# Patient Record
Sex: Female | Born: 1960 | Race: White | Hispanic: No | Marital: Married | Smoking: Current some day smoker
Health system: Southern US, Community
[De-identification: ages and names within clinical notes are randomized; demographics above are authoritative.]

## PROBLEM LIST (undated history)

## (undated) DIAGNOSIS — D6861 Antiphospholipid syndrome: Secondary | ICD-10-CM

## (undated) DIAGNOSIS — Q211 Atrial septal defect: Secondary | ICD-10-CM

## (undated) DIAGNOSIS — N289 Disorder of kidney and ureter, unspecified: Secondary | ICD-10-CM

## (undated) DIAGNOSIS — F99 Mental disorder, not otherwise specified: Secondary | ICD-10-CM

## (undated) DIAGNOSIS — Z9889 Other specified postprocedural states: Secondary | ICD-10-CM

## (undated) DIAGNOSIS — E611 Iron deficiency: Secondary | ICD-10-CM

## (undated) DIAGNOSIS — G2581 Restless legs syndrome: Secondary | ICD-10-CM

## (undated) DIAGNOSIS — M25569 Pain in unspecified knee: Secondary | ICD-10-CM

## (undated) DIAGNOSIS — S2239XA Fracture of one rib, unspecified side, initial encounter for closed fracture: Secondary | ICD-10-CM

## (undated) DIAGNOSIS — Q2111 Secundum atrial septal defect: Secondary | ICD-10-CM

## (undated) DIAGNOSIS — E876 Hypokalemia: Secondary | ICD-10-CM

## (undated) DIAGNOSIS — D509 Iron deficiency anemia, unspecified: Secondary | ICD-10-CM

## (undated) DIAGNOSIS — R569 Unspecified convulsions: Secondary | ICD-10-CM

## (undated) DIAGNOSIS — D6859 Other primary thrombophilia: Secondary | ICD-10-CM

## (undated) DIAGNOSIS — E559 Vitamin D deficiency, unspecified: Secondary | ICD-10-CM

## (undated) DIAGNOSIS — R319 Hematuria, unspecified: Principal | ICD-10-CM

## (undated) DIAGNOSIS — M5020 Other cervical disc displacement, unspecified cervical region: Secondary | ICD-10-CM

## (undated) DIAGNOSIS — M419 Scoliosis, unspecified: Secondary | ICD-10-CM

## (undated) DIAGNOSIS — R112 Nausea with vomiting, unspecified: Secondary | ICD-10-CM

## (undated) DIAGNOSIS — M810 Age-related osteoporosis without current pathological fracture: Secondary | ICD-10-CM

## (undated) DIAGNOSIS — T1490XA Injury, unspecified, initial encounter: Secondary | ICD-10-CM

## (undated) DIAGNOSIS — R7989 Other specified abnormal findings of blood chemistry: Secondary | ICD-10-CM

## (undated) HISTORY — DX: Hypokalemia: E87.6

## (undated) HISTORY — DX: Secundum atrial septal defect: Q21.11

## (undated) HISTORY — PX: PATELLA FRACTURE SURGERY: SHX735

## (undated) HISTORY — DX: Other specified abnormal findings of blood chemistry: R79.89

## (undated) HISTORY — DX: Pain in unspecified knee: M25.569

## (undated) HISTORY — PX: ABDOMINAL HYSTERECTOMY: SHX81

## (undated) HISTORY — DX: Iron deficiency: E61.1

## (undated) HISTORY — DX: Mental disorder, not otherwise specified: F99

## (undated) HISTORY — DX: Atrial septal defect: Q21.1

## (undated) HISTORY — DX: Other cervical disc displacement, unspecified cervical region: M50.20

## (undated) HISTORY — DX: Fracture of one rib, unspecified side, initial encounter for closed fracture: S22.39XA

## (undated) HISTORY — DX: Scoliosis, unspecified: M41.9

## (undated) HISTORY — PX: MULTIPLE TOOTH EXTRACTIONS: SHX2053

## (undated) HISTORY — DX: Vitamin D deficiency, unspecified: E55.9

## (undated) HISTORY — DX: Iron deficiency anemia, unspecified: D50.9

## (undated) HISTORY — DX: Age-related osteoporosis without current pathological fracture: M81.0

## (undated) HISTORY — DX: Restless legs syndrome: G25.81

## (undated) HISTORY — DX: Unspecified convulsions: R56.9

## (undated) HISTORY — DX: Other primary thrombophilia: D68.59

## (undated) HISTORY — DX: Antiphospholipid syndrome: D68.61

## (undated) HISTORY — DX: Injury, unspecified, initial encounter: T14.90XA

## (undated) HISTORY — PX: WRIST FRACTURE SURGERY: SHX121

## (undated) HISTORY — DX: Hematuria, unspecified: R31.9

---

## 1999-12-15 DIAGNOSIS — T1490XA Injury, unspecified, initial encounter: Secondary | ICD-10-CM

## 1999-12-15 HISTORY — DX: Injury, unspecified, initial encounter: T14.90XA

## 2001-09-23 ENCOUNTER — Other Ambulatory Visit: Admission: RE | Admit: 2001-09-23 | Discharge: 2001-09-23 | Payer: Self-pay | Admitting: Obstetrics and Gynecology

## 2003-01-23 ENCOUNTER — Ambulatory Visit (HOSPITAL_COMMUNITY): Admission: RE | Admit: 2003-01-23 | Discharge: 2003-01-23 | Payer: Self-pay | Admitting: Obstetrics and Gynecology

## 2003-01-23 ENCOUNTER — Encounter: Payer: Self-pay | Admitting: Obstetrics and Gynecology

## 2004-01-30 ENCOUNTER — Ambulatory Visit (HOSPITAL_BASED_OUTPATIENT_CLINIC_OR_DEPARTMENT_OTHER): Admission: RE | Admit: 2004-01-30 | Discharge: 2004-01-30 | Payer: Self-pay | Admitting: *Deleted

## 2004-01-30 ENCOUNTER — Ambulatory Visit (HOSPITAL_COMMUNITY): Admission: RE | Admit: 2004-01-30 | Discharge: 2004-01-30 | Payer: Self-pay | Admitting: *Deleted

## 2005-11-23 ENCOUNTER — Ambulatory Visit (HOSPITAL_COMMUNITY): Admission: RE | Admit: 2005-11-23 | Discharge: 2005-11-23 | Payer: Self-pay | Admitting: Obstetrics and Gynecology

## 2006-04-14 ENCOUNTER — Ambulatory Visit (HOSPITAL_COMMUNITY): Admission: RE | Admit: 2006-04-14 | Discharge: 2006-04-14 | Payer: Self-pay | Admitting: Pediatrics

## 2006-04-20 ENCOUNTER — Ambulatory Visit (HOSPITAL_COMMUNITY): Admission: RE | Admit: 2006-04-20 | Discharge: 2006-04-20 | Payer: Self-pay | Admitting: Internal Medicine

## 2006-04-29 ENCOUNTER — Ambulatory Visit (HOSPITAL_COMMUNITY): Admission: RE | Admit: 2006-04-29 | Discharge: 2006-04-29 | Payer: Self-pay | Admitting: Neurology

## 2006-05-11 ENCOUNTER — Ambulatory Visit (HOSPITAL_COMMUNITY): Admission: RE | Admit: 2006-05-11 | Discharge: 2006-05-11 | Payer: Self-pay | Admitting: Neurology

## 2006-07-02 ENCOUNTER — Ambulatory Visit: Payer: Self-pay

## 2006-07-20 ENCOUNTER — Ambulatory Visit (HOSPITAL_COMMUNITY): Admission: RE | Admit: 2006-07-20 | Discharge: 2006-07-20 | Payer: Self-pay | Admitting: Neurology

## 2006-08-30 ENCOUNTER — Ambulatory Visit: Payer: Self-pay | Admitting: Oncology

## 2006-08-30 LAB — MORPHOLOGY: PLT EST: ADEQUATE

## 2006-08-30 LAB — CBC WITH DIFFERENTIAL/PLATELET
BASO%: 1.4 % (ref 0.0–2.0)
Basophils Absolute: 0.1 10*3/uL (ref 0.0–0.1)
Eosinophils Absolute: 0.2 10*3/uL (ref 0.0–0.5)
HCT: 30.8 % — ABNORMAL LOW (ref 34.8–46.6)
HGB: 10.2 g/dL — ABNORMAL LOW (ref 11.6–15.9)
LYMPH%: 23.2 % (ref 14.0–48.0)
MCV: 80.4 fL — ABNORMAL LOW (ref 81.0–101.0)
MONO%: 9.5 % (ref 0.0–13.0)
NEUT%: 63 % (ref 39.6–76.8)
WBC: 5.4 10*3/uL (ref 3.9–10.0)
lymph#: 1.2 10*3/uL (ref 0.9–3.3)

## 2006-08-30 LAB — PROTIME-INR: INR: 1.1 — ABNORMAL LOW (ref 2.00–3.50)

## 2006-08-30 LAB — SEDIMENTATION RATE: Sed Rate: 30 mm/hr — ABNORMAL HIGH (ref 0–22)

## 2006-09-02 LAB — HYPERCOAGULABLE PANEL, COMPREHENSIVE
Anticardiolipin IgM: <7 [MPL'U] (ref <10)
DRVVT 1:1 Mix: 40.3 secs (ref 31.9–44.2)
DRVVT: 62 secs — ABNORMAL HIGH (ref 31.9–44.2)
Homocysteine: 6.1 umol/L (ref 4.0–15.4)
Protein C Activity: 117 % (ref 91–147)
Protein S Activity: 78 % — ABNORMAL LOW (ref 81–180)
Protein S Ag, Total: 104 % (ref 63–126)

## 2006-09-02 LAB — COMPREHENSIVE METABOLIC PANEL
ALT: 15 U/L (ref 0–40)
CO2: 23 mEq/L (ref 19–32)
Chloride: 104 mEq/L (ref 96–112)
Glucose, Bld: 84 mg/dL (ref 70–99)
Sodium: 134 mEq/L — ABNORMAL LOW (ref 135–145)
Total Bilirubin: 0.7 mg/dL (ref 0.3–1.2)
Total Protein: 6.9 g/dL (ref 6.0–8.3)

## 2006-09-02 LAB — APTT: aPTT: 53 seconds — ABNORMAL HIGH (ref 24–37)

## 2006-09-02 LAB — LACTATE DEHYDROGENASE: LDH: 229 U/L (ref 94–250)

## 2006-09-21 ENCOUNTER — Ambulatory Visit (HOSPITAL_COMMUNITY): Admission: RE | Admit: 2006-09-21 | Discharge: 2006-09-21 | Payer: Self-pay | Admitting: Obstetrics & Gynecology

## 2006-11-10 ENCOUNTER — Ambulatory Visit: Payer: Self-pay | Admitting: Oncology

## 2006-12-28 ENCOUNTER — Ambulatory Visit: Payer: Self-pay | Admitting: Oncology

## 2007-01-10 ENCOUNTER — Ambulatory Visit (HOSPITAL_COMMUNITY): Admission: RE | Admit: 2007-01-10 | Discharge: 2007-01-10 | Payer: Self-pay | Admitting: Obstetrics and Gynecology

## 2007-02-14 ENCOUNTER — Ambulatory Visit: Payer: Self-pay | Admitting: Oncology

## 2007-02-23 LAB — CBC WITH DIFFERENTIAL/PLATELET
BASO%: 2.1 % — ABNORMAL HIGH (ref 0.0–2.0)
EOS%: 3.4 % (ref 0.0–7.0)
Eosinophils Absolute: 0.2 10*3/uL (ref 0.0–0.5)
LYMPH%: 26.4 % (ref 14.0–48.0)
MCV: 80 fL — ABNORMAL LOW (ref 81.0–101.0)
MONO%: 10.5 % (ref 0.0–13.0)
NEUT#: 3.7 10*3/uL (ref 1.5–6.5)
NEUT%: 57.6 % (ref 39.6–76.8)
Platelets: 296 10*3/uL (ref 145–400)
RBC: 4.16 10*6/uL (ref 3.70–5.32)
RDW: 13.7 % (ref 11.3–14.5)

## 2007-02-23 LAB — PROTIME-INR
INR: 2.7 (ref 2.00–3.50)
Protime: 32.4 Seconds — ABNORMAL HIGH (ref 10.6–13.4)

## 2007-03-02 LAB — PROTIME-INR

## 2007-03-02 LAB — PROTHROMBIN TIME: INR: 3.6 — ABNORMAL HIGH (ref 0.0–1.5)

## 2007-03-11 LAB — PROTIME-INR: Protime: 42 Seconds — ABNORMAL HIGH (ref 10.6–13.4)

## 2007-03-23 LAB — PROTIME-INR: INR: 1.7 — ABNORMAL LOW (ref 2.00–3.50)

## 2007-04-04 ENCOUNTER — Ambulatory Visit: Payer: Self-pay | Admitting: Oncology

## 2007-06-24 ENCOUNTER — Ambulatory Visit: Payer: Self-pay | Admitting: Oncology

## 2007-12-01 ENCOUNTER — Ambulatory Visit: Payer: Self-pay | Admitting: Oncology

## 2007-12-05 LAB — CBC WITH DIFFERENTIAL/PLATELET
HGB: 11.6 g/dL (ref 11.6–15.9)
LYMPH%: 27.1 % (ref 14.0–48.0)
MCH: 26 pg (ref 26.0–34.0)
MCHC: 31.6 g/dL — ABNORMAL LOW (ref 32.0–36.0)
MONO#: 0.8 10*3/uL (ref 0.1–0.9)
NEUT#: 3.9 10*3/uL (ref 1.5–6.5)
NEUT%: 57.9 % (ref 39.6–76.8)
Platelets: 294 10*3/uL (ref 145–400)
WBC: 6.8 10*3/uL (ref 3.9–10.0)
lymph#: 1.8 10*3/uL (ref 0.9–3.3)

## 2007-12-05 LAB — PROTIME-INR: INR: 2.3 (ref 2.00–3.50)

## 2007-12-28 ENCOUNTER — Other Ambulatory Visit: Admission: RE | Admit: 2007-12-28 | Discharge: 2007-12-28 | Payer: Self-pay | Admitting: Obstetrics and Gynecology

## 2008-03-16 ENCOUNTER — Encounter (HOSPITAL_COMMUNITY): Admission: RE | Admit: 2008-03-16 | Discharge: 2008-04-15 | Payer: Self-pay | Admitting: Oncology

## 2008-03-16 ENCOUNTER — Ambulatory Visit (HOSPITAL_COMMUNITY): Payer: Self-pay | Admitting: Oncology

## 2008-03-26 ENCOUNTER — Inpatient Hospital Stay (HOSPITAL_COMMUNITY): Admission: RE | Admit: 2008-03-26 | Discharge: 2008-03-29 | Payer: Self-pay | Admitting: Obstetrics and Gynecology

## 2008-03-26 ENCOUNTER — Encounter: Payer: Self-pay | Admitting: Obstetrics and Gynecology

## 2008-03-27 ENCOUNTER — Ambulatory Visit: Payer: Self-pay | Admitting: Oncology

## 2008-04-16 ENCOUNTER — Encounter (HOSPITAL_COMMUNITY): Admission: RE | Admit: 2008-04-16 | Discharge: 2008-05-16 | Payer: Self-pay | Admitting: Oncology

## 2008-05-14 ENCOUNTER — Ambulatory Visit (HOSPITAL_COMMUNITY): Payer: Self-pay | Admitting: Oncology

## 2008-05-23 ENCOUNTER — Encounter (HOSPITAL_COMMUNITY): Admission: RE | Admit: 2008-05-23 | Discharge: 2008-06-22 | Payer: Self-pay | Admitting: Oncology

## 2008-05-31 ENCOUNTER — Ambulatory Visit: Payer: Self-pay | Admitting: Oncology

## 2008-06-29 ENCOUNTER — Encounter (HOSPITAL_COMMUNITY): Admission: RE | Admit: 2008-06-29 | Discharge: 2008-07-29 | Payer: Self-pay | Admitting: Oncology

## 2008-06-29 ENCOUNTER — Ambulatory Visit (HOSPITAL_COMMUNITY): Payer: Self-pay | Admitting: Oncology

## 2008-07-04 ENCOUNTER — Ambulatory Visit: Payer: Self-pay | Admitting: Oncology

## 2008-08-15 ENCOUNTER — Ambulatory Visit (HOSPITAL_COMMUNITY): Payer: Self-pay | Admitting: Oncology

## 2008-08-15 ENCOUNTER — Encounter (HOSPITAL_COMMUNITY): Admission: RE | Admit: 2008-08-15 | Discharge: 2008-09-14 | Payer: Self-pay | Admitting: Oncology

## 2008-09-25 ENCOUNTER — Encounter (HOSPITAL_COMMUNITY): Admission: RE | Admit: 2008-09-25 | Discharge: 2008-10-25 | Payer: Self-pay | Admitting: Oncology

## 2008-10-03 ENCOUNTER — Ambulatory Visit (HOSPITAL_COMMUNITY): Payer: Self-pay | Admitting: Oncology

## 2008-10-31 ENCOUNTER — Encounter (HOSPITAL_COMMUNITY): Admission: RE | Admit: 2008-10-31 | Discharge: 2008-11-30 | Payer: Self-pay | Admitting: Oncology

## 2008-11-29 ENCOUNTER — Ambulatory Visit (HOSPITAL_COMMUNITY): Payer: Self-pay | Admitting: Oncology

## 2010-03-10 ENCOUNTER — Ambulatory Visit (HOSPITAL_COMMUNITY): Payer: Self-pay | Admitting: Oncology

## 2010-03-10 ENCOUNTER — Encounter (HOSPITAL_COMMUNITY): Admission: RE | Admit: 2010-03-10 | Discharge: 2010-04-09 | Payer: Self-pay | Admitting: Oncology

## 2010-07-11 ENCOUNTER — Ambulatory Visit (HOSPITAL_COMMUNITY): Admission: RE | Admit: 2010-07-11 | Discharge: 2010-07-11 | Payer: Self-pay | Admitting: Obstetrics & Gynecology

## 2011-01-04 ENCOUNTER — Encounter: Payer: Self-pay | Admitting: Obstetrics and Gynecology

## 2011-01-04 ENCOUNTER — Encounter: Payer: Self-pay | Admitting: Pulmonary Disease

## 2011-03-06 ENCOUNTER — Encounter (HOSPITAL_COMMUNITY): Payer: BC Managed Care – PPO | Attending: Oncology

## 2011-03-06 ENCOUNTER — Other Ambulatory Visit (HOSPITAL_COMMUNITY): Payer: Self-pay

## 2011-03-06 DIAGNOSIS — Z79899 Other long term (current) drug therapy: Secondary | ICD-10-CM | POA: Insufficient documentation

## 2011-03-06 DIAGNOSIS — Z7901 Long term (current) use of anticoagulants: Secondary | ICD-10-CM | POA: Insufficient documentation

## 2011-03-06 DIAGNOSIS — D509 Iron deficiency anemia, unspecified: Secondary | ICD-10-CM | POA: Insufficient documentation

## 2011-03-06 DIAGNOSIS — Z86718 Personal history of other venous thrombosis and embolism: Secondary | ICD-10-CM | POA: Insufficient documentation

## 2011-03-06 DIAGNOSIS — G2581 Restless legs syndrome: Secondary | ICD-10-CM | POA: Insufficient documentation

## 2011-03-06 DIAGNOSIS — D6859 Other primary thrombophilia: Secondary | ICD-10-CM | POA: Insufficient documentation

## 2011-03-08 LAB — PROTIME-INR: INR: 1.29 (ref 0.00–1.49)

## 2011-03-08 LAB — CBC: MCHC: 34 g/dL (ref 30.0–36.0)

## 2011-03-09 ENCOUNTER — Encounter (HOSPITAL_COMMUNITY): Payer: BC Managed Care – PPO

## 2011-03-09 ENCOUNTER — Ambulatory Visit (HOSPITAL_COMMUNITY): Payer: BC Managed Care – PPO | Admitting: Oncology

## 2011-03-09 ENCOUNTER — Other Ambulatory Visit (HOSPITAL_COMMUNITY): Payer: BC Managed Care – PPO

## 2011-03-09 DIAGNOSIS — D6859 Other primary thrombophilia: Secondary | ICD-10-CM

## 2011-04-28 NOTE — Discharge Summary (Signed)
Samantha Fields, Samantha Fields                 ACCOUNT NO.:  [id number]   MEDICAL RECORD NO.:  [id number]          PATIENT TYPE:  INP   LOCATION:  A302                          FACILITY:  [facility]   PHYSICIAN:  Tilda Burrow, M.D. DATE OF BIRTH:  Apr 20, 1961   DATE OF ADMISSION:  03/26/2008  DATE OF DISCHARGE:  04/16/2009LH                               DISCHARGE SUMMARY   ADMITTING DIAGNOSES:  1. Uterine fibroids 14 weeks size.  2. History of lupus antibodies.  3. 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  4. Patent foramen ovale, stable.  5. History of thrombotic cerebrovascular accident event in 2007.   DISCHARGE DIAGNOSES:  1. Uterine fibroids 14 weeks size, removed.  2. History of lupus antibodies.  3. 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  4. Patent foramen ovale, stable.  5. History of thrombotic cerebrovascular accident event in 2007.   PROCEDURES:  Total abdominal hysterectomy.   DISCHARGE MEDICATIONS:  1. Lamictal 100 mg p.o. daily.  2. Hydrochlorothiazide 25 mg p.o. daily.  3. Iron 325 mg p.o. twice daily.  4. Celexa 20 mg p.o. daily.  5. Resume aspirin 325 mg p.o. daily.  6. Lovenox 100 mg subcu twice daily as per Dr. Mariel Sleet, for 1 week.  7. Coumadin 4 mg and 3 mg alternating daily doses.   HOSPITAL SUMMARY:  This is a 50 year old premenopausal female admitted  for abdominal hysterectomy due to symptomatic uterine fibroids and  anemia associated with heavy menses.  History is well documented in the  HPI and notable for the clotting disorders, which has resulted in 4  anticoagulation after 2007.  A minor CVA event from which she had full  recovery.   HOSPITAL COURSE:  The patient was admitted and underwent abdominal  hysterectomy through a Pelosi incision with wide excision of the old  scar on March 26, 2008.  EBL was approximately 100 mL.  Jackson-Pratt  drain was placed in subcu sites.   Postoperatively, the patient remained  stable.  The uterus and fibroids weighed 885 g.  Cervix was benign.  The  patient never had fevers.  She had admitting hemoglobin of 9.4,  hematocrit 29.0.  Postoperative hemoglobin dropped to 8.3, hematocrit  25.3.  Tolerated well by the patient.  She said she did never require  transfusions.  She will simply be followed up through the office. She  had return of bowel function with flatus and resumption of bowel  activity in standard fashion.  We will be seeing back on April 02, 2008,  for staple removal and drain removal and April 03, 2008, for PT and PTT  levels per Dr. Mariel Sleet.      Tilda Burrow, M.D.  Electronically Signed     JVF/MEDQ  D:  03/29/2008  T:  03/29/2008  Job:  [id number]   cc:   Genene Churn. Cyndie Chime, M.D.  Fax: [phone]   Ladona Horns. Mariel Sleet, MD  Fax: [phone]

## 2011-04-28 NOTE — Op Note (Signed)
NAMECLARISE, CHACKO                 ACCOUNT NO.:  [id number]   MEDICAL RECORD NO.:  [id number]          PATIENT TYPE:  INP   LOCATION:  A302                          FACILITY:  [facility]   PHYSICIAN:  Tilda Burrow, M.D. DATE OF BIRTH:  [birth year]   DATE OF PROCEDURE:  03/26/2008  DATE OF DISCHARGE:                               OPERATIVE REPORT   PREOPERATIVE DIAGNOSES:  1. Uterine fibroids 14 weeks.  2. History of lupus antibodies.  3. Prior history of multiple episodes of postprocedure deep vein      thrombosis in lower extremities.  4. History of the patent foramen ovale.  5. History of thrombotic cerebrovascular event 2007.   POSTOPERATIVE DIAGNOSES:  1. Uterine fibroids 14 weeks.  2. History of lupus antibodies.  3. Prior history of multiple episodes postprocedure deep vein      thrombosis in lower extremities.  4. History of the patent foramen ovale.  5. History of thrombotic cerebrovascular event 2007.   PROCEDURE:  Total abdominal hysterectomy.   SURGEON:  Tilda Burrow, M.D.   ASSISTANT:  _____none_____   ANESTHESIA:  General.   COMPLICATIONS:  None.   FINDINGS:  Large 10-cm fibroid enlargement of the uterine fundus.  Huge  singleton fibroid was located just behind the insertion of the left tube  and left round ligament and distorting the left fundal portion of the  uterus primarily.   DETAILS OF PROCEDURE:  The patient was taken to the operating room,  prepped and draped for lower abdominal surgery.  The prior C-section  incision was repeated and extended widely for a total distance of  approximately 35 cm, with removal of a 8 cm wide ellipse of skin and  fatty tissue.  This was taken down through the superficial skin and  fatty tissue with a relatively thick layer of fatty tissue left on the  fascia.  Midline incision was selected for entry of the abdominal cavity  making the, (Pelosi incision).  Peritoneal cavity was easily entered  without any adhesions  encountered and the incision extended cephalad and  caudad sufficiently to visualize the pelvis adequately for surgery.  Large fibroid uterus was quite mobile and inspection of the uterus at  the fibroid was in the left cornual area intramural and by rotating the  uterus inside the abdominal cavity.  We were able to identify the round  ligaments on each side.  Round ligaments were clamped, cut and suture  ligated and taken down.  The uterine vessels on the left side and the  utero-ovarian ligament was somewhat splayed, but could be carefully  taken down and small bites clamping, cutting, and suture ligating with  Kelly clamp placed on the uterine side for back bleeding in the pedicles  ligated with zero chromic and made quite hemostatic.  Horizontal  mattress sutures were used to close to ligate the pedicles on the  uterine body.  Hemostasis was quite good.  The bladder flap was  developed adequately anteriorly.  Once the uterine vessels could be  clamped, cut, and suture ligated on either side using 2 curved  Heaney  clamps double ligature and then a Kelly clamp for back bleeding, the  rest of the procedure was straightforward.  Straight Heaney clamps were  used on the upper aspects of the cardinal ligaments, clamping, cutting  and suture ligating.  The fundus of the uterus was then amputated off of  the small elongated lower uterine segment and visualization dramatically  improved.  Lahey thyroid tenaculum was then positioned on the stop of  the lower uterine segment and we marched down each side of the cervix  clamping, cutting, and suture ligating in standard fashion using  straight Heaney clamps, knife dissection, and 0 chromic suture ligature.  Upon reaching the level of the cervix, a stab incision was made in the  anterior cervical vaginal fornix, Kocher clamp used to grasp the cuff  and the remainder of the cuff amputated off, using Mayo scissors.  The  cuff was then closed with  Aldridge stitch placed at each lateral angle  for reestablishing pelvic support and the cuff reduced in a lateral  diameter by placing a figure-of-eight suture and anteriorly at 12  o'clock and posteriorly at 6 o'clock to produce the transverse diameter  of the vaginal apex.  The remainder of the cuff was closed with  interrupted 0 chromic interrupted sutures across the length of the  vaginal cuff.  Hemostasis was excellent.  Pelvis was irrigated,  inspected and pedicles inspected and hemostasis considered excellent.  Bladder flap was loosely re-approximated over the cuff in the midline.  Laparotomy equipment was removed.  Laparotomy tapes removed, sponge  counts were correct.  Anterior peritoneum was closed with running 2-0  chromic.  The fascia was then closed with 2 segments of continuous  running 0-Prolene 1 segment back and upper half incision on the lower  half.  The subcu fatty tissues were irrigated, inspected, confirmed as  hemostatic.  A flat JP drain was placed in the open space and then the  superficial fatty tissues pulled over with a series of interrupted 2-0  plain sutures and then horizontal subcuticular 2-0 plain sutures  followed by staple closure of the skin.  JP drain was allowed to exit  through the left into the incision was sutured in place.  EBLs of the  total procedure was approximately 100 mL.  The patient tolerated the  procedure excellently with good hemostasis throughout and went to  recovery room in stable condition.   ADDENDUM:  The patient will resume Lovenox prophylaxis 24 hours after  surgery and then be converted back to Coumadin.      Tilda Burrow, M.D.  Electronically Signed     JVF/MEDQ  D:  03/28/2008  T:  03/29/2008  Job:  [id number]
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
## 2011-05-01 NOTE — Procedures (Signed)
EEG NUMBER:  [id number]   CLINICAL HISTORY:  A 50 year old female with an episode of unresponsiveness.  She was unable to be awakened.  EMS was called.  She was confused and to  unable recognize others.   PROCEDURE:  The tracing was carried out on a 32-channel digital Cadwell  recorder reformatted into 16-channel montages with one devoted to EKG.  The  patient was awake during the recording.  The [employer] 10/20 system lead  placement used.   DESCRIPTION OF FINDINGS:  Dominant frequency is an 11 Hz 15-35 microvolt  activity that is well-regulated and attenuates partially with eye opening.   Background activity shows a mixture of frontally predominant beta range  activity.   Photic stimulation induced a sustained driving response between 7 and 17 Hz.  Hyperventilation caused little change.  EKG showed a regular sinus rhythm  with ventricular response at 66 beats per minute.   IMPRESSION:  Normal waking record.      Deanna Artis. Sharene Skeans, M.D.  Electronically Signed     QIO:NGEX  D:  04/20/2006 21:06:00  T:  04/21/2006 20:31:16  Job #:  [id number]   cc:   Leane Platt

## 2011-06-01 ENCOUNTER — Other Ambulatory Visit (HOSPITAL_COMMUNITY): Payer: BC Managed Care – PPO

## 2011-07-13 ENCOUNTER — Telehealth (HOSPITAL_COMMUNITY): Payer: Self-pay | Admitting: *Deleted

## 2011-07-13 NOTE — Telephone Encounter (Signed)
Received call back from pt. States that she has been compliant with monthly pt/inr checks at [facility] labs. Informed her per Dr.Neijstroms note regarding compliance with labs and whether or not she would like a 2nd opinion at [facility] with a coagulation specialist to see if they would d/c Coumadin - it might help. Pt insistent that she has been doing monthly lab checks. Informed her we do not have any results from April or May. She said she will follow up with that because she is sure she had them done. Pt became tearful and said that she will find another doctor to manage her Coumadin. Pt asked that we copy her records and she will come by office this week to pick them up.

## 2011-07-13 NOTE — Telephone Encounter (Signed)
Left messages on both home and cell numbers. Informed pt we needed to speak with her about renewing lab orders through [facility] for monthly pt/inr.

## 2011-07-30 ENCOUNTER — Other Ambulatory Visit: Payer: Self-pay | Admitting: Obstetrics and Gynecology

## 2011-07-30 DIAGNOSIS — Z139 Encounter for screening, unspecified: Secondary | ICD-10-CM

## 2011-08-03 ENCOUNTER — Ambulatory Visit (HOSPITAL_COMMUNITY): Payer: BC Managed Care – PPO | Admitting: Oncology

## 2011-08-14 ENCOUNTER — Encounter (HOSPITAL_COMMUNITY): Payer: BC Managed Care – PPO | Attending: Oncology | Admitting: Oncology

## 2011-08-14 ENCOUNTER — Encounter (HOSPITAL_COMMUNITY): Payer: Self-pay | Admitting: Oncology

## 2011-08-14 ENCOUNTER — Telehealth (HOSPITAL_COMMUNITY): Payer: Self-pay | Admitting: *Deleted

## 2011-08-14 ENCOUNTER — Ambulatory Visit (HOSPITAL_COMMUNITY)
Admission: RE | Admit: 2011-08-14 | Discharge: 2011-08-14 | Disposition: A | Discharge status: Home / Self Care | Payer: BC Managed Care – PPO | Source: Ambulatory Visit | Attending: Obstetrics and Gynecology | Admitting: Obstetrics and Gynecology

## 2011-08-14 DIAGNOSIS — E611 Iron deficiency: Secondary | ICD-10-CM | POA: Insufficient documentation

## 2011-08-14 DIAGNOSIS — D6861 Antiphospholipid syndrome: Secondary | ICD-10-CM

## 2011-08-14 DIAGNOSIS — D6859 Other primary thrombophilia: Secondary | ICD-10-CM | POA: Insufficient documentation

## 2011-08-14 DIAGNOSIS — Z7901 Long term (current) use of anticoagulants: Secondary | ICD-10-CM | POA: Insufficient documentation

## 2011-08-14 DIAGNOSIS — Z1231 Encounter for screening mammogram for malignant neoplasm of breast: Secondary | ICD-10-CM | POA: Insufficient documentation

## 2011-08-14 DIAGNOSIS — D509 Iron deficiency anemia, unspecified: Secondary | ICD-10-CM | POA: Insufficient documentation

## 2011-08-14 DIAGNOSIS — Z139 Encounter for screening, unspecified: Secondary | ICD-10-CM

## 2011-08-14 DIAGNOSIS — R760 Raised antibody titer: Secondary | ICD-10-CM

## 2011-08-14 HISTORY — DX: Antiphospholipid syndrome: D68.61

## 2011-08-14 HISTORY — DX: Iron deficiency: E61.1

## 2011-08-14 LAB — COMPREHENSIVE METABOLIC PANEL
BUN: 13 mg/dL (ref 6–23)
Calcium: 9.5 mg/dL (ref 8.4–10.5)
GFR calc Af Amer: >60 mL/min (ref >60)
Glucose, Bld: 83 mg/dL (ref 70–99)
Total Protein: 6.4 g/dL (ref 6.0–8.3)

## 2011-08-14 LAB — CBC
HCT: 40.3 % (ref 36.0–46.0)
Hemoglobin: 12.8 g/dL (ref 12.0–15.0)
MCH: 28.7 pg (ref 26.0–34.0)
MCHC: 31.8 g/dL (ref 30.0–36.0)

## 2011-08-14 LAB — DIFFERENTIAL
Eosinophils Absolute: 0.1 10*3/uL (ref 0.0–0.7)
Lymphocytes Relative: 25 % (ref 12–46)
Lymphs Abs: 1.1 10*3/uL (ref 0.7–4.0)
Neutrophils Relative %: 62 % (ref 43–77)

## 2011-08-14 LAB — PROTIME-INR: INR: 2.52 — ABNORMAL HIGH (ref 0.00–1.49)

## 2011-08-14 LAB — FERRITIN: Ferritin: 21 ng/mL (ref 10–291)

## 2011-08-14 NOTE — Patient Instructions (Signed)
[facility]  Discharge Instructions  RECOMMENDATIONS MADE BY THE CONSULTANT AND ANY TEST RESULTS WILL BE SENT TO YOUR REFERRING DOCTOR.       SPECIAL INSTRUCTIONS/FOLLOW-UP:  Continue monthly PT/INR checks at [facility] lab. We will check PT/INR today as well as iron studies. We will let you know next week if you are in need of any iron infusions.  I acknowledge that I have been informed and understand all the instructions given to me and received a copy. I do not have any more questions at this time, but understand that I may call the Specialty Clinic at [facility] at [phone] during business hours should I have any further questions or need assistance in obtaining follow-up care.    __________________________________________  _____________  __________ Signature of Patient or Authorized Representative            Date                   Time    __________________________________________ Nurse's Signature

## 2011-08-14 NOTE — Progress Notes (Signed)
Labs drawn today for Pt.  Patient on 3.5,3.5mg  and 4mg  alternating.  Call patient at [phone]

## 2011-08-14 NOTE — Telephone Encounter (Signed)
Spoke with pt. Instruct same dose Coumadin PT/INR in 1 month at [facility]. Also will call in potassium 10 meq to [facility] in [place]. She will need to take until all are gone and recheck potassium level in 1 month at [facility]. Pt verbalizes understanding.

## 2011-08-14 NOTE — Telephone Encounter (Signed)
Message copied by Dennie Maizes on Fri Aug 14, 2011  4:12 PM ------      Message from: Ellouise Newer III      Created: Fri Aug 14, 2011 11:28 AM       Same dose of Coumadin            PT/INR in one month with [facility].

## 2011-08-14 NOTE — Progress Notes (Signed)
Samantha Kussmaul, MD [address] [place] [address]  1. Iron deficiency  CBC, Differential, Comprehensive metabolic panel, Iron and TIBC, Ferritin  2. Antiphospholipid antibody positive  Protime-INR  3. Antiphospholipid antibody syndrome      CURRENT THERAPY: On Coumadin Anticoagulation  INTERVAL HISTORY: Samantha Fields 50 y.o. female returns for  regular  visit for followup of Antiphospholipid antibody syndrome.  There is some conflict regarding whether the patient is regularly getting her PT/INR performed monthly.  She reports that she is, but we are lacking records of that.  She is adamant that she is compliant.  As a result of this missing data, we offered the patient a second opinion at [facility].  At this point she has declined.  She explains that when she was initially seen by Dr. Cyndie Chime, Dr. Sandria Manly, and a few other specialists, the consensus was that she should be on lifelong anticoagulation.  She explains that she sees no point in re-investigating this decision.   The patient denies any complaints.    We spent some time going over patient education regarding iron deficiency and its etiology and pathophysiology.  I see in the past, her lab work reveals borderline iron deficiency.  We will check that again today.  I spent some time going over the risks, benefits, and alternatives of Feraheme in the event that she would need IV iron infusion.   Past Medical History  Diagnosis Date  . Antiphospholipid antibody syndrome   . Seizures   . Iron deficiency anemia   . Scoliosis   . Restless leg syndrome   . Antiphospholipid antibody syndrome 08/14/2011  . Iron deficiency 08/14/2011    has Antiphospholipid antibody syndrome and Iron deficiency on her problem list.      has no known allergies.  Samantha Fields does not currently have medications on file.  Past Surgical History  Procedure Date  . Abdominal hysterectomy   . Cesarean section   . Patella  fracture surgery     left patella  . Wrist fracture surgery     pins/rods    Denies any headaches, dizziness, double vision, fevers, chills, night sweats, nausea, vomiting, diarrhea, constipation, chest pain, heart palpitations, shortness of breath, blood in stool, black tarry stool, urinary pain, urinary burning, urinary frequency, hematuria.   PHYSICAL EXAMINATION   Filed Vitals:   08/14/11 0947  BP: 112/76  Pulse: 85  Temp: 98.4 F (36.9 C)    GENERAL:alert, healthy, no distress, well nourished, well developed, comfortable, cooperative and smiling SKIN: skin color, texture, turgor are normal HEAD: Normocephalic EYES: normal EARS: External ears normal OROPHARYNX:mucous membranes are moist  NECK: trachea midline LYMPH:  not examined BREAST:not examined LUNGS: not examined HEART: not examined ABDOMEN:not examined BACK: Back symmetric, no curvature. EXTREMITIES:less then 2 second capillary refill, no joint deformities, effusion, or inflammation, no skin discoloration, no clubbing, no cyanosis  NEURO: alert & oriented x 3 with fluent speech, no focal motor/sensory deficits, gait normal   LABORATORY DATA: Ordered today: PT/INR, CBC diff, CMET, Iron and TIBC, Ferritin     ASSESSMENT:  1. Antiphospholipid antibody syndrome, on lifelong Coumadin anticoagulation 2. Borderline iron deficiency   PLAN:  1. Return to the clinic in one year time for follow-up 2. Offered the patient a second opinion at [facility] to investigate if she should remain on Coumadin.  She has declined at this point in time. 3. Asked the patient to request any PT/INRs performed at other offices or facilities be  Faxed to the [facility]. 4. Lab work today: PT/INR, CBC diff, CMET, Iron and TIBC, Ferritin 5. Went over the risks, benefits, and alternatives to [facility] in the event that she would need this IV iron infusion in the future. 6. I personally reviewed and went over laboratory results with  the patient. 7. Monthly and as needed PT/INRs 8. Remain at the same dose of Coumadin unless otherwise informed 9. If her lab work reveals iron deficiency anemia, we will have to investigate the cause.  It may warrant a colonoscopy if she has not had one yet due to the fact that she is 50 years old. 10. She does not need any Coumadin refills at this time.   All questions were answered. The patient knows to call the clinic with any problems, questions or concerns. We can certainly see the patient much sooner if necessary.  The patient and plan discussed with Glenford Peers, MD and he is in agreement with the aforementioned.  More than 50% of the time spent with the patient was utilized for counseling.   KEFALAS,THOMAS

## 2011-09-08 LAB — CBC
HCT: 27.2 — ABNORMAL LOW
HCT: 29 — ABNORMAL LOW
HCT: 29.9 — ABNORMAL LOW
Hemoglobin: 9.7 — ABNORMAL LOW
Hemoglobin: 10.4 — ABNORMAL LOW
MCHC: 32.2
MCHC: 32.5
MCHC: 32.4
MCV: 71.9 — ABNORMAL LOW
MCV: 71.5 — ABNORMAL LOW
Platelets: 190
Platelets: 263
Platelets: 180
Platelets: 184
RBC: 4.2
RDW: 16.6 — ABNORMAL HIGH
RDW: 17.7 — ABNORMAL HIGH
RDW: 18.9 — ABNORMAL HIGH
WBC: 6
WBC: 7.8
WBC: 4.5
WBC: 7.2

## 2011-09-08 LAB — PROTIME-INR
INR: 1.1
INR: 2.2 — ABNORMAL HIGH
INR: 1
INR: 1
INR: 1.3
INR: 2 — ABNORMAL HIGH
INR: 2.9 — ABNORMAL HIGH
Prothrombin Time: 14.6
Prothrombin Time: 13.7
Prothrombin Time: 14
Prothrombin Time: 19.8 — ABNORMAL HIGH
Prothrombin Time: 23.3 — ABNORMAL HIGH
Prothrombin Time: 31.7 — ABNORMAL HIGH

## 2011-09-08 LAB — ABO/RH: ABO/RH(D): O POS

## 2011-09-08 LAB — COMPREHENSIVE METABOLIC PANEL
Albumin: 3.6
Alkaline Phosphatase: 79
Alkaline Phosphatase: 83
BUN: 7
BUN: 9
Calcium: 9
Chloride: 101
Glucose, Bld: 88
Glucose, Bld: 90
Potassium: 3.3 — ABNORMAL LOW
Potassium: 3.7
Total Bilirubin: 0.8
Total Protein: 6.1

## 2011-09-08 LAB — DIFFERENTIAL
Basophils Absolute: 0.1
Basophils Absolute: 0.1
Basophils Absolute: 0
Basophils Relative: 0
Basophils Relative: 1
Basophils Relative: 1
Eosinophils Absolute: 0
Eosinophils Absolute: 0.1
Eosinophils Absolute: 0.1
Eosinophils Absolute: 0.1
Eosinophils Relative: 0
Lymphocytes Relative: 22
Lymphocytes Relative: 12
Lymphocytes Relative: 29
Lymphs Abs: 1
Lymphs Abs: 1.3
Monocytes Absolute: 0.6
Monocytes Relative: 13 — ABNORMAL HIGH
Monocytes Relative: 9
Neutro Abs: 3
Neutro Abs: 3.8
Neutro Abs: 2.5
Neutro Abs: 3.9
Neutrophils Relative %: 62
Neutrophils Relative %: 64
Neutrophils Relative %: 77
Neutrophils Relative %: 55
Neutrophils Relative %: 75

## 2011-09-08 LAB — CROSSMATCH

## 2011-09-08 LAB — BASIC METABOLIC PANEL
BUN: 5 — ABNORMAL LOW
BUN: 7
CO2: 27
CO2: 28
Calcium: 8.5
Calcium: 9.2
Chloride: 98
Creatinine, Ser: 0.84
Creatinine, Ser: 0.79
GFR calc non Af Amer: >60
GFR calc non Af Amer: >60
Glucose, Bld: 96
Glucose, Bld: 98
Potassium: 3.4 — ABNORMAL LOW
Sodium: 136

## 2011-09-08 LAB — FERRITIN: Ferritin: 5 — ABNORMAL LOW (ref 10–291)

## 2011-09-08 LAB — IRON AND TIBC
Iron: 23 — ABNORMAL LOW
Saturation Ratios: 5 — ABNORMAL LOW
UIBC: 425

## 2011-09-08 LAB — APTT
aPTT: 42 — ABNORMAL HIGH
aPTT: 60 — ABNORMAL HIGH

## 2011-09-08 LAB — HCG, QUANTITATIVE, PREGNANCY: hCG, Beta Chain, Quant, S: <2

## 2011-09-09 LAB — PROTIME-INR
INR: 2 — ABNORMAL HIGH
Prothrombin Time: 23.6 — ABNORMAL HIGH

## 2011-09-10 LAB — BASIC METABOLIC PANEL
BUN: 11
CO2: 28
Calcium: 8.5
Calcium: 9.2
Creatinine, Ser: 1
GFR calc Af Amer: >60
GFR calc non Af Amer: 59 — ABNORMAL LOW
GFR calc non Af Amer: 55 — ABNORMAL LOW
Glucose, Bld: 94
Glucose, Bld: 94
Potassium: 3.7
Potassium: 3.7
Sodium: 136

## 2011-09-10 LAB — DIFFERENTIAL
Basophils Absolute: 0.1
Eosinophils Relative: 0
Lymphocytes Relative: 21
Monocytes Absolute: 0.5
Monocytes Relative: 10
Neutro Abs: 3.6

## 2011-09-10 LAB — CBC
HCT: 33.1 — ABNORMAL LOW
Hemoglobin: 10.6 — ABNORMAL LOW
MCHC: 32.2
Platelets: 187
RBC: 4.43
RDW: 20.9 — ABNORMAL HIGH
RDW: 18.8 — ABNORMAL HIGH
WBC: 4.7

## 2011-09-10 LAB — PROTIME-INR
INR: 1
INR: 1.7 — ABNORMAL HIGH
INR: 2.6 — ABNORMAL HIGH
Prothrombin Time: 13.1
Prothrombin Time: 16.1 — ABNORMAL HIGH
Prothrombin Time: 29.6 — ABNORMAL HIGH

## 2011-09-10 LAB — FERRITIN: Ferritin: 6 — ABNORMAL LOW (ref 10–291)

## 2011-09-11 LAB — PROTIME-INR
INR: 3.2 — ABNORMAL HIGH
INR: 2.1 — ABNORMAL HIGH
Prothrombin Time: 24.7 — ABNORMAL HIGH

## 2011-09-14 LAB — CBC
MCHC: 33.6
Platelets: 173
RDW: 18.5 — ABNORMAL HIGH

## 2011-09-14 LAB — PROTIME-INR
INR: 3.6 — ABNORMAL HIGH
INR: 6.8
Prothrombin Time: 66 — ABNORMAL HIGH

## 2011-09-14 LAB — FERRITIN: Ferritin: 79 (ref 10–291)

## 2011-09-15 LAB — PROTIME-INR
INR: 3.1 — ABNORMAL HIGH (ref 0.00–1.49)
INR: 3.2 — ABNORMAL HIGH
Prothrombin Time: 34.1 seconds — ABNORMAL HIGH (ref 11.6–15.2)
Prothrombin Time: 35.1 — ABNORMAL HIGH

## 2011-09-16 LAB — CBC
Hemoglobin: 12.5
MCHC: 33.2
RBC: 4.49
WBC: 6.4

## 2011-09-16 LAB — FERRITIN: Ferritin: 94 (ref 10–291)

## 2011-09-16 LAB — PROTIME-INR
INR: 3 — ABNORMAL HIGH
Prothrombin Time: 33.3 — ABNORMAL HIGH

## 2011-09-18 LAB — PROTIME-INR
INR: 3.1 — ABNORMAL HIGH (ref 0.00–1.49)
Prothrombin Time: 34.1 seconds — ABNORMAL HIGH (ref 11.6–15.2)

## 2011-09-24 ENCOUNTER — Telehealth (HOSPITAL_COMMUNITY): Payer: Self-pay | Admitting: *Deleted

## 2011-09-24 NOTE — Telephone Encounter (Signed)
MSG left on patient's cell phone # to try and have PT/INR drawn Friday am 10/12. I asked patient to call us back and confirm that she received this message. 3 refills given for coumadin 1mg /coumadin 3mg  today at [facility].

## 2011-09-25 ENCOUNTER — Telehealth (HOSPITAL_COMMUNITY): Payer: Self-pay | Admitting: *Deleted

## 2011-09-25 NOTE — Telephone Encounter (Signed)
Pt notified to continue same dose of coumadin and repeat inr in 1 month. This message left on voicemail on cell phone.

## 2011-11-07 LAB — PROTIME-INR

## 2011-11-10 ENCOUNTER — Telehealth (HOSPITAL_COMMUNITY): Payer: Self-pay | Admitting: *Deleted

## 2011-11-10 NOTE — Telephone Encounter (Signed)
Pt notified to continue coumadin 3.5/3.5/4 and to repeat level in 1 month. Verbalized understanding. Will do pt level at [facility].

## 2011-11-24 ENCOUNTER — Encounter: Payer: Self-pay | Admitting: Oncology

## 2011-12-14 ENCOUNTER — Telehealth (HOSPITAL_COMMUNITY): Payer: Self-pay | Admitting: *Deleted

## 2011-12-14 NOTE — Telephone Encounter (Signed)
Patient notified that inr was 1.88 and to continue the same dose of coumadin. Return in 4 weeks to lab for inr. Patient verbalized understanding of all instructions.

## 2012-01-09 ENCOUNTER — Other Ambulatory Visit (HOSPITAL_COMMUNITY): Payer: Self-pay | Admitting: Oncology

## 2012-01-27 ENCOUNTER — Telehealth (HOSPITAL_COMMUNITY): Payer: Self-pay | Admitting: *Deleted

## 2012-01-27 NOTE — Telephone Encounter (Signed)
Message left on [phone] cell number for patient to continue same dose of coumadin and to recheck in 2 weeks. Patient instructed to call [facility] with any questions.

## 2012-02-16 ENCOUNTER — Telehealth (HOSPITAL_COMMUNITY): Payer: Self-pay | Admitting: *Deleted

## 2012-02-16 NOTE — Telephone Encounter (Signed)
Pt's PT INR  reporte 31.4 and 2.92. Called [facility] and notified to continue same dose coumadin and have pt level done in one month.

## 2012-02-22 ENCOUNTER — Encounter: Payer: Self-pay | Admitting: Oncology

## 2012-03-01 ENCOUNTER — Encounter: Payer: Self-pay | Admitting: Oncology

## 2012-03-08 ENCOUNTER — Ambulatory Visit (HOSPITAL_COMMUNITY): Payer: BC Managed Care – PPO | Admitting: Oncology

## 2012-03-18 LAB — PROTIME-INR

## 2012-04-25 ENCOUNTER — Encounter: Payer: Self-pay | Admitting: Oncology

## 2012-05-23 ENCOUNTER — Telehealth (HOSPITAL_COMMUNITY): Payer: Self-pay | Admitting: *Deleted

## 2012-05-23 NOTE — Telephone Encounter (Signed)
Pt/inr called to patient's cell and patient notified via voicemail to continue same dose of coumadin and to return to [facility] lab in 4 weeks for inr check. Patient asked to call back and verify that she received msg.

## 2012-05-29 ENCOUNTER — Other Ambulatory Visit (HOSPITAL_COMMUNITY): Payer: Self-pay | Admitting: Oncology

## 2012-06-17 LAB — PROTIME-INR

## 2012-06-20 ENCOUNTER — Telehealth (HOSPITAL_COMMUNITY): Payer: Self-pay | Admitting: *Deleted

## 2012-06-20 NOTE — Telephone Encounter (Signed)
Message left for pt to continue same dose and pt in 4 weeks

## 2012-06-24 ENCOUNTER — Telehealth (HOSPITAL_COMMUNITY): Payer: Self-pay

## 2012-06-24 NOTE — Telephone Encounter (Signed)
Message received from [facility] stating that she had not heard anything about her PT level and that her phone number had changed to [phone].  Message left with instructions to continue same dosage and to get next PT level in 4 weeks.  Call back confirmation requested.

## 2012-07-27 ENCOUNTER — Encounter: Payer: Self-pay | Admitting: Oncology

## 2012-08-01 ENCOUNTER — Other Ambulatory Visit: Payer: Self-pay | Admitting: Cardiology

## 2012-08-01 DIAGNOSIS — N183 Chronic kidney disease, stage 3 unspecified: Secondary | ICD-10-CM

## 2012-08-03 ENCOUNTER — Encounter (INDEPENDENT_AMBULATORY_CARE_PROVIDER_SITE_OTHER): Payer: BC Managed Care – PPO

## 2012-08-03 DIAGNOSIS — N183 Chronic kidney disease, stage 3 unspecified: Secondary | ICD-10-CM

## 2012-08-12 ENCOUNTER — Ambulatory Visit (HOSPITAL_COMMUNITY): Payer: BC Managed Care – PPO | Admitting: Oncology

## 2012-08-22 ENCOUNTER — Telehealth (HOSPITAL_COMMUNITY): Payer: Self-pay

## 2012-08-22 NOTE — Telephone Encounter (Signed)
Call back confirmation received from patient.  Knows to continue current dosage of coumadin (4mg , 3.5mg , 3.5mg , etc.) and to get next PT/INR in 4 weeks @ approx. 09/19/12.

## 2012-08-22 NOTE — Telephone Encounter (Signed)
Message left for patient to continue same dosage of coumadin and to have next PT/INR in 4 weeks.  Call back confirmation requested.

## 2012-10-04 ENCOUNTER — Encounter (HOSPITAL_COMMUNITY): Payer: Self-pay | Admitting: Oncology

## 2012-10-04 ENCOUNTER — Encounter (HOSPITAL_COMMUNITY): Payer: BC Managed Care – PPO | Attending: Oncology | Admitting: Oncology

## 2012-10-04 VITALS — BP 117/61 | HR 70 | Temp 98.5°F | Resp 18 | Wt 195.9 lb

## 2012-10-04 DIAGNOSIS — D509 Iron deficiency anemia, unspecified: Secondary | ICD-10-CM

## 2012-10-04 DIAGNOSIS — D6861 Antiphospholipid syndrome: Secondary | ICD-10-CM

## 2012-10-04 DIAGNOSIS — N289 Disorder of kidney and ureter, unspecified: Secondary | ICD-10-CM | POA: Insufficient documentation

## 2012-10-04 DIAGNOSIS — D6859 Other primary thrombophilia: Secondary | ICD-10-CM

## 2012-10-04 DIAGNOSIS — E611 Iron deficiency: Secondary | ICD-10-CM

## 2012-10-04 DIAGNOSIS — Z7901 Long term (current) use of anticoagulants: Secondary | ICD-10-CM | POA: Insufficient documentation

## 2012-10-04 LAB — CBC WITH DIFFERENTIAL/PLATELET
Basophils Absolute: 0.1 10*3/uL (ref 0.0–0.1)
Eosinophils Relative: 2 % (ref 0–5)
HCT: 37.5 % (ref 36.0–46.0)
Hemoglobin: 12.1 g/dL (ref 12.0–15.0)
Lymphocytes Relative: 20 % (ref 12–46)
Lymphs Abs: 1 10*3/uL (ref 0.7–4.0)
MCV: 84.5 fL (ref 78.0–100.0)
Monocytes Absolute: 0.5 10*3/uL (ref 0.1–1.0)
Neutro Abs: 3.2 10*3/uL (ref 1.7–7.7)
RBC: 4.44 MIL/uL (ref 3.87–5.11)
RDW: 15.2 % (ref 11.5–15.5)
WBC: 4.9 10*3/uL (ref 4.0–10.5)

## 2012-10-04 LAB — PROTIME-INR: INR: 2.52 — ABNORMAL HIGH (ref 0.00–1.49)

## 2012-10-04 LAB — IRON AND TIBC
Saturation Ratios: 9 % — ABNORMAL LOW (ref 20–55)
TIBC: 445 ug/dL (ref 250–470)
UIBC: 404 ug/dL — ABNORMAL HIGH (ref 125–400)

## 2012-10-04 NOTE — Progress Notes (Signed)
Samantha Fields presented for labwork. Labs per MD order drawn via Peripheral Line 23 gauge needle inserted in left hand  Good blood return present. Procedure without incident.  Needle removed intact. Patient tolerated procedure well.

## 2012-10-04 NOTE — Progress Notes (Signed)
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 Diagnosis #2 Iron deficiency anemia in the past Diagnosis #3 mild renal insufficiency for which she is being evaluated by Dr. Belinda Fisher is well-controlled typically with her Coumadin. She is feeling fine. She still bruises more readily been other people. She feels fine vital signs are stable. Temperature iron levels and CBC. She is changing primary care physician since her primary care physician left town. She'll be seen Dr. Margo Aye the very near future. She has Dr. Caryn Section and Dr. love also seen her a regular basis. Also sees her gynecologist on a regular basis. We will see her in one year. We'll continue Coumadin.

## 2012-10-04 NOTE — Patient Instructions (Addendum)
[facility]  Discharge Instructions  RECOMMENDATIONS MADE BY THE CONSULTANT AND ANY TEST RESULTS WILL BE SENT TO YOUR REFERRING DOCTOR.   EXAM FINDINGS BY MD TODAY AND SIGNS AND SYMPTOMS TO REPORT TO CLINIC OR PRIMARY MD: discussion by MD.  Will check some blood work today.  If we need to make any changes in your coumadin doses we will call you.  MEDICATIONS PRESCRIBED: none   INSTRUCTIONS GIVEN AND DISCUSSED: Other :  Report any unusual bruising or bleeding.  SPECIAL INSTRUCTIONS/FOLLOW-UP: Lab work Needed today and Return to Clinic in 1 year to see MD.   I acknowledge that I have been informed and understand all the instructions given to me and received a copy. I do not have any more questions at this time, but understand that I may call the Specialty Clinic at [facility] at [phone] during business hours should I have any further questions or need assistance in obtaining follow-up care.    __________________________________________  _____________  __________ Signature of Patient or Authorized Representative            Date                   Time    __________________________________________ Nurse's Signature

## 2012-10-05 ENCOUNTER — Encounter: Payer: Self-pay | Admitting: Oncology

## 2012-10-05 ENCOUNTER — Telehealth (HOSPITAL_COMMUNITY): Payer: Self-pay

## 2012-10-07 ENCOUNTER — Encounter (HOSPITAL_COMMUNITY): Payer: Self-pay

## 2012-10-10 ENCOUNTER — Other Ambulatory Visit (HOSPITAL_COMMUNITY): Payer: Self-pay | Admitting: Oncology

## 2012-10-10 NOTE — Telephone Encounter (Signed)
Spoke with patient and she wants the iron infusion and will come on 10/31.

## 2012-10-13 ENCOUNTER — Encounter (HOSPITAL_BASED_OUTPATIENT_CLINIC_OR_DEPARTMENT_OTHER): Payer: BC Managed Care – PPO

## 2012-10-13 VITALS — BP 109/74 | HR 76 | Temp 97.6°F | Resp 18

## 2012-10-13 DIAGNOSIS — E611 Iron deficiency: Secondary | ICD-10-CM

## 2012-10-13 DIAGNOSIS — D509 Iron deficiency anemia, unspecified: Secondary | ICD-10-CM

## 2012-10-13 MED ORDER — SODIUM CHLORIDE 0.9 % IV SOLN
1020.0000 mg | Freq: Once | INTRAVENOUS | Status: AC
  Administered 2012-10-13: 1020 mg via INTRAVENOUS
  Filled 2012-10-13: qty 34

## 2012-10-13 MED ORDER — SODIUM CHLORIDE 0.9 % IJ SOLN
INTRAMUSCULAR | Status: AC
  Filled 2012-10-13: qty 10

## 2012-10-13 MED ORDER — SODIUM CHLORIDE 0.9 % IJ SOLN
10.0000 mL | INTRAMUSCULAR | Status: DC | PRN
  Administered 2012-10-13: 10 mL
  Filled 2012-10-13: qty 10

## 2012-10-13 MED ORDER — SODIUM CHLORIDE 0.9 % IV SOLN
Freq: Once | INTRAVENOUS | Status: AC
  Administered 2012-10-13: 16:00:00 via INTRAVENOUS

## 2012-11-21 ENCOUNTER — Telehealth (HOSPITAL_COMMUNITY): Payer: Self-pay

## 2012-11-21 NOTE — Telephone Encounter (Signed)
Message left on patient's voicemail to continue same dosage of coumadin and to get PT/INR in 4 weeks.  Call back confirmation requested.

## 2012-11-22 ENCOUNTER — Telehealth (HOSPITAL_COMMUNITY): Payer: Self-pay

## 2012-11-22 NOTE — Telephone Encounter (Signed)
Call back confirmation received from patient verifying to continue coumadin same dosage and to get next PT/INR in 4 weeks.

## 2012-12-12 ENCOUNTER — Other Ambulatory Visit: Payer: Self-pay | Admitting: Obstetrics and Gynecology

## 2012-12-12 DIAGNOSIS — Z139 Encounter for screening, unspecified: Secondary | ICD-10-CM

## 2012-12-13 ENCOUNTER — Ambulatory Visit (HOSPITAL_COMMUNITY)
Admission: RE | Admit: 2012-12-13 | Discharge: 2012-12-13 | Disposition: A | Discharge status: Home / Self Care | Payer: BC Managed Care – PPO | Source: Ambulatory Visit | Attending: Obstetrics and Gynecology | Admitting: Obstetrics and Gynecology

## 2012-12-13 DIAGNOSIS — Z1231 Encounter for screening mammogram for malignant neoplasm of breast: Secondary | ICD-10-CM | POA: Insufficient documentation

## 2012-12-13 DIAGNOSIS — Z139 Encounter for screening, unspecified: Secondary | ICD-10-CM

## 2012-12-27 ENCOUNTER — Encounter: Payer: Self-pay | Admitting: Oncology

## 2013-01-09 ENCOUNTER — Telehealth (HOSPITAL_COMMUNITY): Payer: Self-pay

## 2013-01-09 NOTE — Telephone Encounter (Signed)
Message left on patient's voice mail to continue same dosage of coumadin and to get next PT/INR drawn in 4 week.  Call back confirmation requested.

## 2013-01-09 NOTE — Telephone Encounter (Signed)
Call back confirmation received and patient verbalized understanding to continue coumadin same dosage and to recheck PT/INR in 4 weeks.

## 2013-01-21 ENCOUNTER — Other Ambulatory Visit (HOSPITAL_COMMUNITY): Payer: Self-pay | Admitting: Oncology

## 2013-03-08 LAB — PROTIME-INR
INR: 2.2
Protime: 23.3 seconds

## 2013-03-14 ENCOUNTER — Encounter (HOSPITAL_COMMUNITY): Payer: Self-pay

## 2013-03-14 ENCOUNTER — Telehealth (HOSPITAL_COMMUNITY): Payer: Self-pay | Admitting: *Deleted

## 2013-03-14 ENCOUNTER — Telehealth (HOSPITAL_COMMUNITY): Payer: Self-pay

## 2013-03-14 NOTE — Telephone Encounter (Signed)
Message left for patient to continue same dose of coumadin. Recheck level in 4 weeks.  PT 23.3 and INR 2.15 on 3/26 at [facility]

## 2013-03-14 NOTE — Telephone Encounter (Signed)
Call back confirmation from [facility] that she understands instructions to continue same dosage of coumadin and to get next PT/INR in 4 weeks.

## 2013-03-23 ENCOUNTER — Other Ambulatory Visit (HOSPITAL_COMMUNITY): Payer: Self-pay | Admitting: Oncology

## 2013-04-13 ENCOUNTER — Telehealth: Payer: Self-pay | Admitting: Neurology

## 2013-04-17 ENCOUNTER — Other Ambulatory Visit: Payer: Self-pay

## 2013-04-17 MED ORDER — TOPIRAMATE 25 MG PO TABS: 75.0000 mg | ORAL_TABLET | Freq: Every day | ORAL | Status: DC

## 2013-04-17 NOTE — Telephone Encounter (Signed)
Former Love patient, has not been assigned new provider.  Auth refills via WID  

## 2013-04-25 ENCOUNTER — Telehealth: Payer: Self-pay | Admitting: Neurology

## 2013-04-25 NOTE — Telephone Encounter (Signed)
Left message for patient to call to schedule appointment

## 2013-05-03 ENCOUNTER — Telehealth (HOSPITAL_COMMUNITY): Payer: Self-pay

## 2013-05-03 LAB — PROTIME-INR: Protime: 22

## 2013-05-03 NOTE — Telephone Encounter (Signed)
Spoke with patient's husband and  instructed that Heidy is to  continue coumadin same dosage and to return for next PT/INR in 3 weeks.  Verbalizes that he will give patient the message.

## 2013-05-19 ENCOUNTER — Telehealth: Payer: Self-pay | Admitting: *Deleted

## 2013-05-24 ENCOUNTER — Telehealth: Payer: Self-pay | Admitting: Neurology

## 2013-05-25 NOTE — Telephone Encounter (Signed)
Sent to scheduling

## 2013-05-26 ENCOUNTER — Other Ambulatory Visit (HOSPITAL_COMMUNITY): Payer: Self-pay | Admitting: Oncology

## 2013-05-26 DIAGNOSIS — D6861 Antiphospholipid syndrome: Secondary | ICD-10-CM

## 2013-05-26 LAB — PROTIME-INR: INR: 2.3 — AB (ref 0.9–1.1)

## 2013-05-26 MED ORDER — WARFARIN SODIUM 1 MG PO TABS: 1.0000 mg | ORAL_TABLET | Freq: Every day | ORAL | Status: DC

## 2013-05-29 ENCOUNTER — Telehealth (HOSPITAL_COMMUNITY): Payer: Self-pay | Admitting: *Deleted

## 2013-05-29 NOTE — Telephone Encounter (Signed)
Message left on answering machine for pt to continue same dose coumadin and recheck pt/inr in 3 weeks. Instruct pt to return call to clinic to verify message received and understood.

## 2013-06-13 ENCOUNTER — Telehealth (HOSPITAL_COMMUNITY): Payer: Self-pay

## 2013-06-13 NOTE — Telephone Encounter (Signed)
Message left for Rolene to get INR checked this week due to taking Erythromycin.  Call back confirmation requested.

## 2013-06-13 NOTE — Telephone Encounter (Signed)
Call back from [facility] regarding PT/INR needed. [place] and will get PT/INR done when she returns on 06/19/13.

## 2013-06-19 ENCOUNTER — Telehealth: Payer: Self-pay | Admitting: Neurology

## 2013-06-25 ENCOUNTER — Other Ambulatory Visit: Payer: Self-pay | Admitting: Neurology

## 2013-06-26 ENCOUNTER — Telehealth (HOSPITAL_COMMUNITY): Payer: Self-pay

## 2013-06-26 ENCOUNTER — Ambulatory Visit: Payer: Self-pay | Admitting: Neurology

## 2013-06-26 LAB — PROTIME-INR: INR: 2.2 — AB (ref 0.9–1.1)

## 2013-06-26 NOTE — Telephone Encounter (Signed)
Message left for patient to continue same dosage of coumadin and to get next PT/INR in 3 weeks.  Call back confirmation requested.

## 2013-06-29 ENCOUNTER — Encounter (HOSPITAL_COMMUNITY): Payer: Self-pay

## 2013-06-30 ENCOUNTER — Ambulatory Visit: Payer: Self-pay | Admitting: Neurology

## 2013-07-03 ENCOUNTER — Encounter (HOSPITAL_COMMUNITY): Payer: Self-pay

## 2013-07-03 ENCOUNTER — Telehealth (HOSPITAL_COMMUNITY): Payer: Self-pay

## 2013-07-03 ENCOUNTER — Encounter: Payer: Self-pay | Admitting: Oncology

## 2013-07-03 NOTE — Telephone Encounter (Signed)
Message left regarding continuing coumadin same dosage and to recheck INR 3 weeks from last blood draw.  Call back confirmation requested.

## 2013-07-05 ENCOUNTER — Telehealth: Payer: Self-pay | Admitting: Neurology

## 2013-07-06 ENCOUNTER — Encounter: Payer: Self-pay | Admitting: Neurology

## 2013-07-06 DIAGNOSIS — G2581 Restless legs syndrome: Secondary | ICD-10-CM

## 2013-07-06 DIAGNOSIS — D6859 Other primary thrombophilia: Secondary | ICD-10-CM

## 2013-07-06 DIAGNOSIS — M25569 Pain in unspecified knee: Secondary | ICD-10-CM | POA: Insufficient documentation

## 2013-07-07 ENCOUNTER — Ambulatory Visit (INDEPENDENT_AMBULATORY_CARE_PROVIDER_SITE_OTHER): Payer: BC Managed Care – PPO | Admitting: Neurology

## 2013-07-07 ENCOUNTER — Encounter: Payer: Self-pay | Admitting: Neurology

## 2013-07-07 VITALS — BP 113/70 | HR 70 | Ht 65.0 in | Wt 193.0 lb

## 2013-07-07 DIAGNOSIS — R569 Unspecified convulsions: Secondary | ICD-10-CM

## 2013-07-07 MED ORDER — TOPAMAX 25 MG PO TABS: ORAL_TABLET | ORAL | Status: DC

## 2013-07-07 NOTE — Patient Instructions (Signed)
Overall you are doing fairly well but I do want to suggest a few things today:   Remember to drink plenty of fluid, eat healthy meals and do not skip any meals. Try to eat protein with a every meal and eat a healthy snack such as fruit or nuts in between meals. Try to keep a regular sleep-wake schedule and try to exercise daily, particularly in the form of walking, 20-30 minutes a day, if you can.   We are not making any medication changes today. Please continue 3 tablets of Topamax 25mg  once daily. Continue the Mirapex 0.25mg  as needed nightly for restless leg syndrome. Continue to have your iron levels monitored.    I would like to see you back in one year, sooner if we need to. Please call us with any interim questions, concerns, problems, updates or refill requests.   Please also call us for any test results so we can go over those with you on the phone.  My clinical assistant and will answer any of your questions and relay your messages to me and also relay most of my messages to you.   Our phone number is [phone]. We also have an after hours call service for urgent matters and there is a physician on-call for urgent questions. For any emergencies you know to call 911 or go to the nearest emergency room

## 2013-07-07 NOTE — Progress Notes (Addendum)
Provider:  Dr Hosie Poisson Referring Provider: No ref. provider found Primary Care Physician:  Vivia Ewing, MD  Chief Complaint  Patient presents with  . Seizures    # 16 Revisit    HPI:  Samantha Fields is a 52 y.o. female here as a follow up.  Overall she is doing well since her last visit. Denies any further seizure activity. Her last seizure was in 2007. She is currently taking Topamax 75 mg once daily, this is not the extended release formulation. She expresses understanding  this medication should be taken twice daily but does wish to continue taking her current regimen. She is tolerating this medication well denies any visual changes eye pain, change in sense of smell or taste or difficulty with word finding. Since her last visit with Dr. love she was evaluated by nephrology for an elevated creatinine this was found to be benign.  She was started on Mirapex at her last visit for restless leg syndrome. She states that she takes this less than 1-2 times per week as her symptoms are mild. She has recently been getting iron infusions for iron deficiency.  She denies any other acute issues at this time reports overall she is doing quite well. She reports that she is fatigued and has been getting little sleep lately. 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  Per Dr Sandria Manly prior note of 02/2012: 51y/o woman hx of seizure disorder, characterized by nocturnal generalized motor seizures in 03/2006 and 06/2006, currently taking Topamax 75mg  qdaily. Has tried Lamictal in past but developed a rash, tried Keppra but did not tolerate. Started on Mirapex 0.25mg  for RLS symptoms at last visit.  Review of Systems: Out of a complete 14 system review, the patient complains of only the following symptoms, and all other reviewed systems are negative. Review of systems are positive for insomnia and racing thoughts  History   Social History  . Marital Status:  Married    Spouse Name: N/A    Number of Children: 2  . Years of Education: college   Occupational History  . Administrator     [employer]   Social History Main Topics  . Smoking status: Former Games developer  . Smokeless tobacco: Never Used  . Alcohol Use: No  . Drug Use: No  . Sexually Active: Yes   Other Topics Concern  . Not on file   Social History Narrative   Patient is Production designer, theatre/television/film for [employer]. Patient lives at home with her husband Elliot Gurney). Two grown children,one is adopted.    Caffeine-20 oz soda daily.    Family History  Problem Relation Age of Onset  . Diabetes Father   . Aneurysm Brother     Past Medical History  Diagnosis Date  . Antiphospholipid antibody syndrome   . Seizures   . Iron deficiency anemia   . Scoliosis   . Restless leg syndrome   . Antiphospholipid antibody syndrome 08/14/2011  . Iron deficiency 08/14/2011  . Elevated serum creatinine   . Ostium secundum type atrial septal defect   . Pain in joint, lower leg     jnee  . Primary hypercoagulable state     Past Surgical History  Procedure Laterality Date  . Abdominal hysterectomy    . Cesarean section    . Patella fracture surgery      left patella  . Wrist fracture surgery  pins/rods    Current Outpatient Prescriptions  Medication Sig Dispense Refill  . citalopram (CELEXA) 40 MG tablet Take 40 mg by mouth daily.        . hydrochlorothiazide (,MICROZIDE/HYDRODIURIL,) 12.5 MG capsule Take 12.5 mg by mouth daily.        . iron polysaccharides (NIFEREX) 150 MG capsule Take 150 mg by mouth daily.        . pramipexole (MIRAPEX) 0.25 MG tablet Take 0.25 mg by mouth at bedtime as needed and may repeat dose one time if needed.      . Prenatal Vit-Fe Fumarate-FA (M-VIT) tablet Take 1 tablet by mouth daily.      . TOPAMAX 25 MG tablet TAKE THREE TABLETS BY MOUTH ONCE DAILY --  **PATIENT  NEEDS  TO  SCHEDULE  APPOINTMENT  WITH  PRESCRIBER**  90 tablet  0  .  warfarin (COUMADIN) 1 MG tablet Take 1 tablet (1 mg total) by mouth daily.  60 tablet  0  . warfarin (COUMADIN) 3 MG tablet Take 3 mg by mouth daily. 3.5 mg 3.5 mg 4 mg alternating      . warfarin (COUMADIN) 3 MG tablet TAKE AS DIRECTED  60 tablet  2   No current facility-administered medications for this visit.    Allergies as of 07/07/2013 - Review Complete 07/06/2013  Allergen Reaction Noted  . Lamictal (lamotrigine)  07/06/2013    Vitals: There were no vitals taken for this visit. Last Weight:  Wt Readings from Last 1 Encounters:  10/04/12 195 lb 14.4 oz (88.86 kg)   Last Height:   Ht Readings from Last 1 Encounters:  No data found for Ht     Physical exam: Exam: Gen: NAD, conversant Eyes: anicteric sclerae, moist conjunctivae HENT: Atraumati Lungs: CTA, no wheezing, rales, rhonic                          CV: RRR, no MRG Abdomen: Soft, non-tender;  Extremities: No peripheral edema  Skin: Normal temperature, no rash,  Psych: Appropriate affect, pleasant  Neuro: MS: AA&Ox3, appropriately interactive, normal affect   Attention: WORLD backwards  Speech: fluent w/o paraphasic error  Memory: good recent and remote recall  CN: PERRL, EOMI no nystagmus, no ptosis, sensation intact to LT V1-V3 bilat, face symmetric, no weakness, hearing grossly intact, palate elevates symmetrically, shoulder shrug 5/5 bilat,  tongue protrudes midline, no fasiculations noted.  Motor: normal bulk and tone Strength: 5/5  In all extremities  Coord: rapid alternating and point-to-point (FNF, HTS) movements intact.  Reflexes: symmetrical, bilat downgoing toes  Sens: LT intact in all extremities  Gait: posture, stance, stride and arm-swing normal. .   Assessment:  After physical and neurologic examination, review of laboratory studies, imaging, neurophysiology testing and pre-existing records, assessment will be reviewed on the problem list.  Plan:  Treatment plan and additional  workup will be reviewed under Problem List.  Samantha Fields is a pleasant 52 year old woman with history of 2 episodes of nocturnal generalized tonic-clonic seizures. She is currently well controlled on a regimen of Topamax 25 mg once daily, she is tolerating this medication well. She also has a diagnosis of restless leg syndrome, this is a mild case and she occasionally will take Mirapex 0.25 mg nightly for these symptoms. She also is receiving IV iron infusions.  #1 seizure disorder  Clinically stable  She will continue Topamax 75 mg once daily, was discussed with the patient the potential benefit of  switching to standard dosing of twice daily. At this time she wishes to remain on her current regimen she is well controlled and not have any further seizures. She has followup with eye doctor for routine eye care.  #2 restless leg syndrome  Symptoms are clinically stable. She continues have the option of taking Mirapex 0.5 mg nightly as needed if symptoms increase in severity. We can titrate this up further in the future if warranted or consider switching to gabapentin. She also continued to have her iron levels monitored by her primary care physician and received iron infusion as needed  #3 hypercoagulable state  Clinically stable continue on Coumadin as per our care physician  Followup patient is scheduled to followup in one month or earlier as needed  A total of 30 minutes was spent with this patient over half the time was spent in face-to-face discussion about her current diagnoses. Time was spent discussing different therapeutic options for restless leg syndrome and the benefits of using iron infusion. We also discussed potential side effects and risks of Topamax and things to watch out for. All questions were fully answered.  I have read the note, and I agree with the clinical assessment and plan. Lesly Dukes

## 2013-07-10 NOTE — Telephone Encounter (Signed)
Call back from [facility], confirming received instructions regarding Coumadin.

## 2013-07-23 ENCOUNTER — Other Ambulatory Visit (HOSPITAL_COMMUNITY): Payer: Self-pay | Admitting: Oncology

## 2013-07-31 ENCOUNTER — Telehealth (HOSPITAL_COMMUNITY): Payer: Self-pay

## 2013-07-31 NOTE — Telephone Encounter (Signed)
Patient  instructed to continue coumadin 3.34m, 3.5mg , 4mg  etc and to get next PT/INR in 2 weeks.  Verbalizes understanding.

## 2013-08-12 LAB — PROTIME-INR

## 2013-08-15 ENCOUNTER — Telehealth (HOSPITAL_COMMUNITY): Payer: Self-pay

## 2013-08-15 LAB — PROTIME-INR: INR: 2.4 — AB (ref 0.9–1.1)

## 2013-08-15 NOTE — Telephone Encounter (Signed)
Patient instructed to continue coumadin same dosage and to get next PT/INR in 3 weeks. Verbalizes understanding.

## 2013-08-24 ENCOUNTER — Other Ambulatory Visit (HOSPITAL_COMMUNITY): Payer: Self-pay | Admitting: Oncology

## 2013-08-24 DIAGNOSIS — D6861 Antiphospholipid syndrome: Secondary | ICD-10-CM

## 2013-08-24 MED ORDER — WARFARIN SODIUM 1 MG PO TABS: 1.0000 mg | ORAL_TABLET | Freq: Every day | ORAL | Status: DC

## 2013-09-13 ENCOUNTER — Telehealth (HOSPITAL_COMMUNITY): Payer: Self-pay

## 2013-09-13 LAB — PROTIME-INR: INR: 1.9 — AB (ref 0.9–1.1)

## 2013-09-13 NOTE — Telephone Encounter (Signed)
Message left on patient's voicemail instructed to increase coumadin by 1 mg for 2 days then go back to original dosage and to get next INR in 10 - 14 days.  Call back confirmation requested.

## 2013-09-15 ENCOUNTER — Encounter (HOSPITAL_COMMUNITY): Payer: Self-pay

## 2013-09-25 ENCOUNTER — Other Ambulatory Visit (HOSPITAL_COMMUNITY): Payer: Self-pay | Admitting: Oncology

## 2013-09-25 ENCOUNTER — Telehealth (HOSPITAL_COMMUNITY): Payer: Self-pay

## 2013-09-25 LAB — PROTIME-INR
INR: 2.5 — AB (ref 0.9–1.1)
Protime: 26.2 seconds

## 2013-09-25 NOTE — Telephone Encounter (Signed)
Call back confirmation regarding coumadin dosage and when to return for PT/INR recevied.

## 2013-09-25 NOTE — Telephone Encounter (Signed)
Message left on patient's voicemail and instructed to continue coumadin same dosage and to return for next PT/INR in 3 weeks.  Call back confirmation requested.

## 2013-10-02 ENCOUNTER — Encounter: Payer: Self-pay | Admitting: Oncology

## 2013-10-03 ENCOUNTER — Ambulatory Visit (HOSPITAL_COMMUNITY): Payer: BC Managed Care – PPO

## 2013-10-04 NOTE — Progress Notes (Signed)
This encounter was created in error - please disregard.

## 2013-10-11 ENCOUNTER — Ambulatory Visit (HOSPITAL_COMMUNITY): Payer: PRIVATE HEALTH INSURANCE

## 2013-10-12 NOTE — Progress Notes (Signed)
This encounter was created in error - please disregard.

## 2013-10-23 ENCOUNTER — Other Ambulatory Visit: Payer: Self-pay | Admitting: Adult Health

## 2013-10-25 ENCOUNTER — Ambulatory Visit (HOSPITAL_COMMUNITY): Payer: BC Managed Care – PPO

## 2013-10-26 NOTE — Progress Notes (Signed)
This encounter was created in error - please disregard.

## 2013-11-06 ENCOUNTER — Encounter (HOSPITAL_COMMUNITY): Payer: Self-pay

## 2013-11-06 ENCOUNTER — Encounter (HOSPITAL_COMMUNITY): Payer: BC Managed Care – PPO | Attending: Hematology and Oncology

## 2013-11-06 ENCOUNTER — Ambulatory Visit (HOSPITAL_COMMUNITY): Payer: BC Managed Care – PPO

## 2013-11-06 ENCOUNTER — Other Ambulatory Visit: Payer: Self-pay | Admitting: Adult Health

## 2013-11-06 VITALS — BP 126/80 | HR 76 | Temp 97.9°F | Resp 18 | Wt 191.0 lb

## 2013-11-06 DIAGNOSIS — D649 Anemia, unspecified: Secondary | ICD-10-CM

## 2013-11-06 DIAGNOSIS — D6859 Other primary thrombophilia: Secondary | ICD-10-CM

## 2013-11-06 DIAGNOSIS — E611 Iron deficiency: Secondary | ICD-10-CM

## 2013-11-06 DIAGNOSIS — D509 Iron deficiency anemia, unspecified: Secondary | ICD-10-CM | POA: Insufficient documentation

## 2013-11-06 DIAGNOSIS — Z139 Encounter for screening, unspecified: Secondary | ICD-10-CM

## 2013-11-06 LAB — CBC WITH DIFFERENTIAL/PLATELET
Basophils Absolute: 0.1 10*3/uL (ref 0.0–0.1)
Eosinophils Absolute: 0.1 10*3/uL (ref 0.0–0.7)
Eosinophils Relative: 1 % (ref 0–5)
Lymphocytes Relative: 15 % (ref 12–46)
Lymphs Abs: 0.9 10*3/uL (ref 0.7–4.0)
MCHC: 32.1 g/dL (ref 30.0–36.0)
MCV: 91.9 fL (ref 78.0–100.0)
Monocytes Relative: 11 % (ref 3–12)
Neutrophils Relative %: 72 % (ref 43–77)
Platelets: 215 10*3/uL (ref 150–400)
RDW: 14.3 % (ref 11.5–15.5)
WBC: 6 10*3/uL (ref 4.0–10.5)

## 2013-11-06 LAB — D-DIMER, QUANTITATIVE: D-Dimer, Quant: <0.27 ug/mL-FEU (ref 0.00–0.48)

## 2013-11-06 LAB — IRON AND TIBC
Saturation Ratios: 16 % — ABNORMAL LOW (ref 20–55)
UIBC: 303 ug/dL (ref 125–400)

## 2013-11-06 LAB — PROTIME-INR: INR: 3.24 — ABNORMAL HIGH (ref 0.00–1.49)

## 2013-11-06 NOTE — Progress Notes (Signed)
[facility]  OFFICE PROGRESS NOTE  Vivia Ewing, MD [address] [place] [address]  DIAGNOSIS: Primary hypercoagulable state - Plan: Protime-INR, D-dimer, quantitative, Protime-INR  Iron deficiency - Plan: CBC with Differential, Ferritin  Anemia, unspecified - Plan: Iron and TIBC  Chief Complaint  Patient presents with  . Thrombophilia    CURRENT THERAPY: Warfarin for antiphospholipid antibody syndrome.  INTERVAL HISTORY: Samantha SCHUMAN 52 y.o. female returns for followup of antiphospholipid antibody syndrome while taking warfarin.  She continues to do well with no episodes of epistaxis, easy bruising, melena, hematochezia, hematuria, hemoptysis, fever, night sweats, PND, orthopnea, palpitations, significant lower extremity pain or swelling, headache, or seizures.  MEDICAL HISTORY: Past Medical History  Diagnosis Date  . Antiphospholipid antibody syndrome   . Seizures   . Iron deficiency anemia   . Scoliosis   . Restless leg syndrome   . Antiphospholipid antibody syndrome 08/14/2011  . Iron deficiency 08/14/2011  . Elevated serum creatinine   . Ostium secundum type atrial septal defect   . Pain in joint, lower leg     jnee  . Primary hypercoagulable state     INTERIM HISTORY: has Antiphospholipid antibody syndrome; Iron deficiency; Restless leg syndrome; Pain in joint, lower leg; and Primary hypercoagulable state on her problem list.    ALLERGIES:  is allergic to lamictal.  MEDICATIONS: has a current medication list which includes the following prescription(s): citalopram, hydrochlorothiazide, topamax, warfarin, warfarin, iron polysaccharides, and pramipexole.  SURGICAL HISTORY:  Past Surgical History  Procedure Laterality Date  . Abdominal hysterectomy    . Cesarean section    . Patella fracture surgery      left patella  . Wrist fracture surgery      pins/rods    FAMILY HISTORY: family history includes  Aneurysm in her brother; Diabetes in her father.  SOCIAL HISTORY:  reports that she has been smoking Cigarettes.  She has been smoking about 0.00 packs per day. She has never used smokeless tobacco. She reports that she drinks about 0.6 ounces of alcohol per week. She reports that she does not use illicit drugs.  REVIEW OF SYSTEMS:  Other than that discussed above is noncontributory.  PHYSICAL EXAMINATION: ECOG PERFORMANCE STATUS: 0 - Asymptomatic  Blood pressure 126/80, pulse 76, temperature 97.9 F (36.6 C), temperature source Oral, resp. rate 18, weight 191 lb (86.637 kg).  GENERAL:alert, no distress and comfortable SKIN: skin color, texture, turgor are normal, no rashes or significant lesions EYES: PERLA; Conjunctiva are pink and non-injected, sclera clear OROPHARYNX:no exudate, no erythema on lips, buccal mucosa, or tongue. NECK: supple, thyroid normal size, non-tender, without nodularity. No masses CHEST: Normal AP diameter with no breast masses. LYMPH:  no palpable lymphadenopathy in the cervical, axillary or inguinal LUNGS: clear to auscultation and percussion with normal breathing effort HEART: Regular with no S3. There is a grade 1-2/6 ejection systolic murmur made worse on deep inspiration and nonradiating.. ABDOMEN:abdomen soft, non-tender and normal bowel sounds MUSCULOSKELETAL:no cyanosis of digits and no clubbing. Range of motion normal.  NEURO: alert & oriented x 3 with fluent speech, no focal motor/sensory deficits   LABORATORY DATA: Office Visit on 11/06/2013  Component Date Value Range Status  . D-Dimer, Quant 11/06/2013 <0.27  0.00 - 0.48 ug/mL-FEU Final   Comment:  AT THE INHOUSE ESTABLISHED CUTOFF                          VALUE OF 0.48 ug/mL FEU,                          THIS ASSAY HAS BEEN DOCUMENTED                          IN THE LITERATURE TO HAVE                          A SENSITIVITY AND NEGATIVE                           PREDICTIVE VALUE OF AT LEAST                          98 TO 99%.  THE TEST RESULT                          SHOULD BE CORRELATED WITH                          AN ASSESSMENT OF THE CLINICAL                          PROBABILITY OF DVT / VTE.  . WBC 11/06/2013 6.0  4.0 - 10.5 K/uL Final  . RBC 11/06/2013 4.55  3.87 - 5.11 MIL/uL Final  . Hemoglobin 11/06/2013 13.4  12.0 - 15.0 g/dL Final  . HCT 40/98/1191 41.8  36.0 - 46.0 % Final  . MCV 11/06/2013 91.9  78.0 - 100.0 fL Final  . MCH 11/06/2013 29.5  26.0 - 34.0 pg Final  . MCHC 11/06/2013 32.1  30.0 - 36.0 g/dL Final  . RDW 47/82/9562 14.3  11.5 - 15.5 % Final  . Platelets 11/06/2013 215  150 - 400 K/uL Final  . Neutrophils Relative % 11/06/2013 72  43 - 77 % Final  . Lymphocytes Relative 11/06/2013 15  12 - 46 % Final  . Monocytes Relative 11/06/2013 11  3 - 12 % Final  . Eosinophils Relative 11/06/2013 1  0 - 5 % Final  . Basophils Relative 11/06/2013 1  0 - 1 % Final  . Neutro Abs 11/06/2013 4.2  1.7 - 7.7 K/uL Final  . Lymphs Abs 11/06/2013 0.9  0.7 - 4.0 K/uL Final  . Monocytes Absolute 11/06/2013 0.7  0.1 - 1.0 K/uL Final  . Eosinophils Absolute 11/06/2013 0.1  0.0 - 0.7 K/uL Final  . Basophils Absolute 11/06/2013 0.1  0.0 - 0.1 K/uL Final  . WBC Morphology 11/06/2013 WHITE COUNT CONFIRMED ON SMEAR   Final   ATYPICAL LYMPHOCYTES  . Smear Review 11/06/2013 PLATELET COUNT CONFIRMED BY SMEAR   Final   Comment: LARGE PLATELETS PRESENT                          GIANT PLATELETS SEEN  . Ferritin 11/06/2013 48  10 - 291 ng/mL Final   Performed at [facility]  . Iron 11/06/2013 56  42 - 135 ug/dL Final  . TIBC 13/07/6577 359  250 - 470 ug/dL Final  .  Saturation Ratios 11/06/2013 16* 20 - 55 % Final  . UIBC 11/06/2013 303  125 - 400 ug/dL Final   Performed at [facility]  . Prothrombin Time 11/06/2013 31.9* 11.6 - 15.2 seconds Final  . INR 11/06/2013 3.24* 0.00 - 1.49 Final    PATHOLOGY: None new.  Urinalysis No  results found for this basename: colorurine,  appearanceur,  labspec,  phurine,  glucoseu,  hgbur,  bilirubinur,  ketonesur,  proteinur,  urobilinogen,  nitrite,  leukocytesur    RADIOGRAPHIC STUDIES: No results found.  ASSESSMENT:  #1. Thrombophilia manifesting as antiphospholipid antibody, tolerating warfarin well. #2. Possible seizure disorder, on long-term Topamax.   PLAN:  #1. Continue warfarin taking 3.5 mg followed by 3.5 mg followed by 4 mg in 3 day cycles. #2. She was introduced 20 although as an alternative but currently feels content with her current management. #3. If CBC and ferritin are low, intravenous iron will be administered. #4. Monthly INR with adjustment and warfarin dose based upon determination. Office visit in 6 months.   All questions were answered. The patient knows to call the clinic with any problems, questions or concerns. We can certainly see the patient much sooner if necessary.   I spent 25 minutes counseling the patient face to face. The total time spent in the appointment was 30 minutes.    Maurilio Lovely, MD 11/07/2013 6:32 AM

## 2013-11-06 NOTE — Patient Instructions (Signed)
.  [facility] Discharge Instructions  RECOMMENDATIONS MADE BY THE CONSULTANT AND ANY TEST RESULTS WILL BE SENT TO YOUR REFERRING PHYSICIAN.  EXAM FINDINGS BY THE PHYSICIAN TODAY AND SIGNS OR SYMPTOMS TO REPORT TO CLINIC OR PRIMARY PHYSICIAN: Exam and findings as discussed by Dr. Zigmund Daniel.Marland Kitchen  MEDICATIONS PRESCRIBED:  Coumadin as prescribed   INSTRUCTIONS/FOLLOW-UP: Monthly INR We will call you if you need iron infusion Return in 6 months  Thank you for choosing [facility] to provide your oncology and hematology care.  To afford each patient quality time with our providers, please arrive at least 15 minutes before your scheduled appointment time.  With your help, our goal is to use those 15 minutes to complete the necessary work-up to ensure our physicians have the information they need to help with your evaluation and healthcare recommendations.    Effective January 1st, 2014, we ask that you re-schedule your appointment with our physicians should you arrive 10 or more minutes late for your appointment.  We strive to give you quality time with our providers, and arriving late affects you and other patients whose appointments are after yours.    Again, thank you for choosing [facility].  Our hope is that these requests will decrease the amount of time that you wait before being seen by our physicians.       _____________________________________________________________  Should you have questions after your visit to [facility], please contact our office at [phone] between the hours of 8:30 a.m. and 5:00 p.m.  Voicemails left after 4:30 p.m. will not be returned until the following business day.  For prescription refill requests, have your pharmacy contact our office with your prescription refill request.

## 2013-11-07 LAB — FERRITIN: Ferritin: 48 ng/mL (ref 10–291)

## 2013-11-20 ENCOUNTER — Telehealth (HOSPITAL_COMMUNITY): Payer: Self-pay | Admitting: *Deleted

## 2013-11-20 ENCOUNTER — Other Ambulatory Visit: Payer: Self-pay | Admitting: Adult Health

## 2013-11-20 ENCOUNTER — Encounter (HOSPITAL_COMMUNITY): Payer: Self-pay | Admitting: *Deleted

## 2013-11-20 NOTE — Progress Notes (Signed)
Per Dr. Zigmund Daniel, No need for IV iron. INR 3.14--your direction was fine. Re.. Telephone encounter 11/20/13

## 2013-11-20 NOTE — Telephone Encounter (Signed)
Patient would like a call about the results of her iron studies and to know if she needs iron infusion. She did not receive call about her pt level in November. I talked to [facility] and we have advised her to hold it for 2 days then resume the same dose she was on. She can not get PT level drawn until 12/13 to recheck.

## 2013-11-27 ENCOUNTER — Telehealth (HOSPITAL_COMMUNITY): Payer: Self-pay

## 2013-11-27 LAB — PROTIME-INR
INR: 2.4 — AB (ref 0.9–1.1)
Protime: 25.3 seconds

## 2013-11-27 NOTE — Telephone Encounter (Signed)
Patient notified to continue same dosage of coumadin.  States she is taking coumadin 3.5mg , 3.5mg , 4 mg etc.  To have next INR in 4 weeks.  Verbalized understanding of instructions.

## 2013-12-04 ENCOUNTER — Ambulatory Visit (INDEPENDENT_AMBULATORY_CARE_PROVIDER_SITE_OTHER): Payer: PRIVATE HEALTH INSURANCE | Admitting: Adult Health

## 2013-12-04 ENCOUNTER — Encounter: Payer: Self-pay | Admitting: Adult Health

## 2013-12-04 VITALS — BP 110/62 | HR 72 | Ht 67.0 in | Wt 194.0 lb

## 2013-12-04 DIAGNOSIS — Z01419 Encounter for gynecological examination (general) (routine) without abnormal findings: Secondary | ICD-10-CM

## 2013-12-04 DIAGNOSIS — Z1212 Encounter for screening for malignant neoplasm of rectum: Secondary | ICD-10-CM

## 2013-12-04 DIAGNOSIS — Z139 Encounter for screening, unspecified: Secondary | ICD-10-CM

## 2013-12-04 DIAGNOSIS — F419 Anxiety disorder, unspecified: Secondary | ICD-10-CM | POA: Insufficient documentation

## 2013-12-04 LAB — HEMOCCULT GUIAC POC 1CARD (OFFICE)

## 2013-12-04 MED ORDER — HYDROCHLOROTHIAZIDE 25 MG PO TABS: ORAL_TABLET | ORAL | Status: DC

## 2013-12-04 MED ORDER — CITALOPRAM HYDROBROMIDE 40 MG PO TABS: ORAL_TABLET | ORAL | Status: DC
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
## 2013-12-04 NOTE — Patient Instructions (Signed)
Physical in 1 year Mammogram yearly Labs at work Colonoscopy needed refer to Dr Jena Gauss

## 2013-12-12 ENCOUNTER — Telehealth: Payer: Self-pay

## 2013-12-12 NOTE — Telephone Encounter (Signed)
Pt was referred by Cyril Mourning for screening colonoscopy. She is on coumadin and will need OV first. LMOM for pt to return call.

## 2013-12-19 ENCOUNTER — Ambulatory Visit (HOSPITAL_COMMUNITY)
Admission: RE | Admit: 2013-12-19 | Discharge: 2013-12-19 | Disposition: A | Discharge status: Home / Self Care | Payer: PRIVATE HEALTH INSURANCE | Source: Ambulatory Visit | Attending: Adult Health | Admitting: Adult Health

## 2013-12-19 DIAGNOSIS — Z139 Encounter for screening, unspecified: Secondary | ICD-10-CM

## 2013-12-19 DIAGNOSIS — Z1231 Encounter for screening mammogram for malignant neoplasm of breast: Secondary | ICD-10-CM | POA: Insufficient documentation

## 2014-01-01 ENCOUNTER — Other Ambulatory Visit (HOSPITAL_COMMUNITY): Payer: Self-pay | Admitting: Oncology

## 2014-01-01 ENCOUNTER — Other Ambulatory Visit: Payer: Self-pay | Admitting: Adult Health

## 2014-01-01 DIAGNOSIS — D6861 Antiphospholipid syndrome: Secondary | ICD-10-CM

## 2014-01-01 LAB — PROTIME-INR: INR: 2.4 — AB (ref 0.9–1.1)

## 2014-01-01 MED ORDER — WARFARIN SODIUM 1 MG PO TABS: 1.0000 mg | ORAL_TABLET | Freq: Every day | ORAL | Status: DC

## 2014-01-01 MED ORDER — WARFARIN SODIUM 3 MG PO TABS: 3.0000 mg | ORAL_TABLET | Freq: Every day | ORAL | Status: DC

## 2014-01-02 ENCOUNTER — Telehealth (HOSPITAL_COMMUNITY): Payer: Self-pay | Admitting: Oncology

## 2014-01-02 ENCOUNTER — Telehealth: Payer: Self-pay | Admitting: Adult Health

## 2014-01-02 LAB — TSH: TSH: 1.262 u[IU]/mL (ref 0.350–4.500)

## 2014-01-02 NOTE — Telephone Encounter (Signed)
Left message that lab normal

## 2014-01-02 NOTE — Telephone Encounter (Signed)
Samantha Fields called and message left on home answering machine to repeat inr in 3 weeks and to continue current dose of coumadin - return call to clinic requested.  Cell was in invalid number.

## 2014-01-21 LAB — PROTIME-INR

## 2014-02-01 ENCOUNTER — Encounter: Payer: Self-pay | Admitting: Gastroenterology

## 2014-02-09 NOTE — Telephone Encounter (Signed)
PT is scheduled to see Neil Crouch, PA in office on 02/19/2014 at 9:00 AM. ( No paper notes).

## 2014-02-12 ENCOUNTER — Telehealth (HOSPITAL_COMMUNITY): Payer: Self-pay

## 2014-02-12 LAB — PROTIME-INR (CHCC SATELLITE)
INR: 2.7
PROTIME: 28.3 s

## 2014-02-12 NOTE — Telephone Encounter (Signed)
Patient instructed to continue coumadin 3.5mg , 3.5mg , 4 mg etc and to have next INR done in 3 - 4 weeks.  Verbalizes understanding.

## 2014-02-19 ENCOUNTER — Telehealth: Payer: Self-pay | Admitting: Gastroenterology

## 2014-02-19 ENCOUNTER — Ambulatory Visit: Payer: PRIVATE HEALTH INSURANCE | Admitting: Gastroenterology

## 2014-02-19 ENCOUNTER — Encounter: Payer: Self-pay | Admitting: Gastroenterology

## 2014-02-19 NOTE — Telephone Encounter (Signed)
Mailed letter °

## 2014-02-19 NOTE — Telephone Encounter (Signed)
Pt was a no show

## 2014-04-14 LAB — PROTIME-INR: INR: 2.2 — AB (ref 0.9–1.1)

## 2014-04-16 ENCOUNTER — Encounter (HOSPITAL_COMMUNITY): Payer: Self-pay | Admitting: *Deleted

## 2014-04-16 NOTE — Progress Notes (Signed)
Message left on patient's voicemail to continue same dose of coumadin. INR in 4 weeks.

## 2014-04-17 ENCOUNTER — Telehealth: Payer: Self-pay

## 2014-04-17 ENCOUNTER — Telehealth: Payer: Self-pay | Admitting: Adult Health

## 2014-04-17 MED ORDER — PHENAZOPYRIDINE HCL 200 MG PO TABS: 200.0000 mg | ORAL_TABLET | Freq: Three times a day (TID) | ORAL | Status: DC | PRN

## 2014-04-17 MED ORDER — SULFAMETHOXAZOLE-TMP DS 800-160 MG PO TABS: 1.0000 | ORAL_TABLET | Freq: Two times a day (BID) | ORAL | Status: DC

## 2014-04-17 NOTE — Telephone Encounter (Signed)
Has frequency and burning and pain when peeing did OTC strip +nitrates rx septra ds 1 bid x 7 days and pyridium push fluids

## 2014-04-17 NOTE — Telephone Encounter (Signed)
Left message to call back  

## 2014-04-27 ENCOUNTER — Ambulatory Visit (INDEPENDENT_AMBULATORY_CARE_PROVIDER_SITE_OTHER): Payer: PRIVATE HEALTH INSURANCE | Admitting: Adult Health

## 2014-04-27 ENCOUNTER — Encounter: Payer: Self-pay | Admitting: Adult Health

## 2014-04-27 VITALS — BP 112/78 | Temp 98.3°F | Ht 67.0 in | Wt 184.0 lb

## 2014-04-27 DIAGNOSIS — R319 Hematuria, unspecified: Secondary | ICD-10-CM

## 2014-04-27 HISTORY — DX: Hematuria, unspecified: R31.9

## 2014-04-27 LAB — POCT URINALYSIS DIPSTICK

## 2014-04-27 NOTE — Progress Notes (Signed)
Subjective:     Patient ID: Samantha Fields, female   DOB: 02/21/1961, 53 y.o.   MRN: [id number]  HPI [place] is a 53 year old white female in complaining of peeing blood.She had called 5/5 having burning with urination and did strip at home that was + nitrates so I called pyridium and septra ds in and she took it for 5 days an was fine til yesterday had pain right side then bleeding started. She is on coumadin and had INR of 2.2 Saturday, she did not take last nights dose and is due INR Monday, I called Tom at oncology at [facility] and made him aware.She is teary but she is also a Research officer, trade [employer].  Review of Systems See HPI Reviewed past medical,surgical, social and family history. Reviewed medications and allergies.     Objective:   Physical Exam BP 112/78  Temp(Src) 98.3 F (36.8 C)  Ht 5\' 7"  (1.702 m)  Wt 184 lb (83.462 kg)  BMI 28.81 kg/m2   urine frank blood 3+protein and 3+ blood No CVAT, on pelvic exam the  cuff is Smooth no masses or tenderness noted Discussed with Dr Elonda Husky. Pt aware could be kidney stone or cystis, will get culture first but will refer to urologist if indicated.  Assessment:     Hematuria    Plan:     UA C&S Will talk Monday Push fluids If pain go to ER   Review handout on hematuria and resume coumadin

## 2014-04-27 NOTE — Patient Instructions (Signed)
Hematuria, Adult Hematuria is blood in your urine. It can be caused by a bladder infection, kidney infection, prostate infection, kidney stone, or cancer of your urinary tract. Infections can usually be treated with medicine, and a kidney stone usually will pass through your urine. If neither of these is the cause of your hematuria, further workup to find out the reason may be needed. It is very important that you tell your health care provider about any blood you see in your urine, even if the blood stops without treatment or happens without causing pain. Blood in your urine that happens and then stops and then happens again can be a symptom of a very serious condition. Also, pain is not a symptom in the initial stages of many urinary cancers. HOME CARE INSTRUCTIONS   Drink lots of fluid, 3 4 quarts a day. If you have been diagnosed with an infection, cranberry juice is especially recommended, in addition to large amounts of water.  Avoid caffeine, tea, and carbonated beverages, because they tend to irritate the bladder.  Avoid alcohol because it may irritate the prostate.  Only take over-the-counter or prescription medicines for pain, discomfort, or fever as directed by your health care provider.  If you have been diagnosed with a kidney stone, follow your health care provider's instructions regarding straining your urine to catch the stone.  Empty your bladder often. Avoid holding urine for long periods of time.  After a bowel movement, women should cleanse front to back. Use each tissue only once.  Empty your bladder before and after sexual intercourse if you are a female. SEEK MEDICAL CARE IF: You develop back pain, fever, a feeling of sickness in your stomach (nausea), or vomiting or if your symptoms are not better in 3 days. Return sooner if you are getting worse. SEEK IMMEDIATE MEDICAL CARE IF:   You have a persistent fever, with a temperature of 101.83F (38.8C) or greater.  You  develop severe vomiting and are unable to keep the medicine down.  You develop severe back or abdominal pain despite taking your medicines.  You begin passing a large amount of blood or clots in your urine.  You feel extremely weak or faint, or you pass out. MAKE SURE YOU:   Understand these instructions.  Will watch your condition.  Will get help right away if you are not doing well or get worse. Document Released: 11/30/2005 Document Revised: 09/20/2013 Document Reviewed: 07/31/2013 [facility] Patient Information 2014 [place]. PUSH fluids Will talk  monday

## 2014-04-28 LAB — URINE CULTURE
Colony Count: NO GROWTH
Organism ID, Bacteria: NO GROWTH

## 2014-04-28 LAB — URINALYSIS
Glucose, UA: NEGATIVE mg/dL
KETONES UR: 15 mg/dL — AB
Nitrite: POSITIVE — AB
PH: 6 (ref 5.0–8.0)
Specific Gravity, Urine: 1.02 (ref 1.005–1.030)
Urobilinogen, UA: 1 mg/dL (ref 0.0–1.0)

## 2014-04-30 ENCOUNTER — Encounter (HOSPITAL_COMMUNITY): Payer: Self-pay

## 2014-04-30 ENCOUNTER — Telehealth: Payer: Self-pay | Admitting: Adult Health

## 2014-04-30 ENCOUNTER — Encounter (HOSPITAL_COMMUNITY): Payer: PRIVATE HEALTH INSURANCE | Attending: Hematology and Oncology

## 2014-04-30 VITALS — BP 117/76 | HR 82 | Temp 98.0°F | Resp 18 | Wt 189.0 lb

## 2014-04-30 DIAGNOSIS — N390 Urinary tract infection, site not specified: Secondary | ICD-10-CM | POA: Insufficient documentation

## 2014-04-30 DIAGNOSIS — D509 Iron deficiency anemia, unspecified: Secondary | ICD-10-CM | POA: Insufficient documentation

## 2014-04-30 DIAGNOSIS — D6859 Other primary thrombophilia: Secondary | ICD-10-CM | POA: Insufficient documentation

## 2014-04-30 DIAGNOSIS — Z7901 Long term (current) use of anticoagulants: Secondary | ICD-10-CM

## 2014-04-30 DIAGNOSIS — Z09 Encounter for follow-up examination after completed treatment for conditions other than malignant neoplasm: Secondary | ICD-10-CM | POA: Insufficient documentation

## 2014-04-30 DIAGNOSIS — G2581 Restless legs syndrome: Secondary | ICD-10-CM | POA: Insufficient documentation

## 2014-04-30 DIAGNOSIS — D6861 Antiphospholipid syndrome: Secondary | ICD-10-CM

## 2014-04-30 DIAGNOSIS — E611 Iron deficiency: Secondary | ICD-10-CM

## 2014-04-30 DIAGNOSIS — F17200 Nicotine dependence, unspecified, uncomplicated: Secondary | ICD-10-CM | POA: Insufficient documentation

## 2014-04-30 LAB — CBC WITH DIFFERENTIAL/PLATELET
Basophils Absolute: 0.1 10*3/uL (ref 0.0–0.1)
Basophils Relative: 1 % (ref 0–1)
Eosinophils Absolute: 0.1 10*3/uL (ref 0.0–0.7)
Eosinophils Relative: 2 % (ref 0–5)
HEMATOCRIT: 37 % (ref 36.0–46.0)
HEMOGLOBIN: 11.9 g/dL — AB (ref 12.0–15.0)
Lymphocytes Relative: 19 % (ref 12–46)
Lymphs Abs: 1.2 10*3/uL (ref 0.7–4.0)
MCH: 29.3 pg (ref 26.0–34.0)
MCHC: 32.2 g/dL (ref 30.0–36.0)
MCV: 91.1 fL (ref 78.0–100.0)
MONOS PCT: 5 % (ref 3–12)
Monocytes Absolute: 0.4 10*3/uL (ref 0.1–1.0)
NEUTROS ABS: 4.9 10*3/uL (ref 1.7–7.7)
Neutrophils Relative %: 73 % (ref 43–77)
Platelets: 281 10*3/uL (ref 150–400)
RBC: 4.06 MIL/uL (ref 3.87–5.11)
RDW: 15.3 % (ref 11.5–15.5)
WBC: 6.7 10*3/uL (ref 4.0–10.5)

## 2014-04-30 LAB — PROTIME-INR
INR: 2.86 — AB (ref 0.00–1.49)
Prothrombin Time: 29 seconds — ABNORMAL HIGH (ref 11.6–15.2)

## 2014-04-30 LAB — D-DIMER, QUANTITATIVE: D-Dimer, Quant: 0.34 ug/mL-FEU (ref 0.00–0.48)

## 2014-04-30 NOTE — Patient Instructions (Signed)
[facility] Discharge Instructions  RECOMMENDATIONS MADE BY THE CONSULTANT AND ANY TEST RESULTS WILL BE SENT TO YOUR REFERRING PHYSICIAN.  EXAM FINDINGS BY THE PHYSICIAN TODAY AND SIGNS OR SYMPTOMS TO REPORT TO CLINIC OR PRIMARY PHYSICIAN: you saw dr Justin Mend today    INSTRUCTIONS GIVEN AND DISCUSSED: Follow-up in 6 months and labs    Thank you for choosing [facility] to provide your oncology and hematology care.  To afford each patient quality time with our providers, please arrive at least 15 minutes before your scheduled appointment time.  With your help, our goal is to use those 15 minutes to complete the necessary work-up to ensure our physicians have the information they need to help with your evaluation and healthcare recommendations.    Effective January 1st, 2014, we ask that you re-schedule your appointment with our physicians should you arrive 10 or more minutes late for your appointment.  We strive to give you quality time with our providers, and arriving late affects you and other patients whose appointments are after yours.    Again, thank you for choosing [facility].  Our hope is that these requests will decrease the amount of time that you wait before being seen by our physicians.       _____________________________________________________________  Should you have questions after your visit to [facility], please contact our office at [phone] between the hours of 8:30 a.m. and 5:00 p.m.  Voicemails left after 4:30 p.m. will not be returned until the following business day.  For prescription refill requests, have your pharmacy contact our office with your prescription refill request.
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
## 2014-04-30 NOTE — Telephone Encounter (Signed)
Pt says bleeding has stopped, aware of urine and that I made referral to dr Tamala Julian for 6/16 at 8:30 am, I also gave her the number for [facility] if she wants to change the appt.

## 2014-04-30 NOTE — Progress Notes (Signed)
[name]'s reason for visit today is for labs as scheduled per MD orders.  Venipuncture performed with a 23 gauge butterfly needle to  Right hand.  Samantha Fields tolerated procedure well and without incident; questions were answered and patient was discharged.

## 2014-05-01 LAB — FERRITIN: Ferritin: 72 ng/mL (ref 10–291)

## 2014-05-11 ENCOUNTER — Ambulatory Visit (INDEPENDENT_AMBULATORY_CARE_PROVIDER_SITE_OTHER): Payer: PRIVATE HEALTH INSURANCE | Admitting: Urology

## 2014-05-11 ENCOUNTER — Other Ambulatory Visit: Payer: Self-pay | Admitting: Urology

## 2014-05-11 DIAGNOSIS — R310 Gross hematuria: Secondary | ICD-10-CM

## 2014-05-11 DIAGNOSIS — R1031 Right lower quadrant pain: Secondary | ICD-10-CM

## 2014-05-22 ENCOUNTER — Ambulatory Visit (HOSPITAL_COMMUNITY): Payer: PRIVATE HEALTH INSURANCE

## 2014-05-25 ENCOUNTER — Ambulatory Visit (HOSPITAL_COMMUNITY)
Admission: RE | Admit: 2014-05-25 | Discharge: 2014-05-25 | Disposition: A | Discharge status: Home / Self Care | Payer: PRIVATE HEALTH INSURANCE | Source: Ambulatory Visit | Attending: Urology | Admitting: Urology

## 2014-05-25 DIAGNOSIS — K449 Diaphragmatic hernia without obstruction or gangrene: Secondary | ICD-10-CM | POA: Diagnosis not present

## 2014-05-25 DIAGNOSIS — R933 Abnormal findings on diagnostic imaging of other parts of digestive tract: Secondary | ICD-10-CM | POA: Diagnosis not present

## 2014-05-25 DIAGNOSIS — R310 Gross hematuria: Secondary | ICD-10-CM | POA: Diagnosis present

## 2014-05-25 LAB — POCT I-STAT CREATININE: Creatinine, Ser: 1.4 mg/dL — ABNORMAL HIGH (ref 0.50–1.10)

## 2014-05-25 MED ORDER — IOHEXOL 300 MG/ML  SOLN
125.0000 mL | Freq: Once | INTRAMUSCULAR | Status: AC | PRN
  Administered 2014-05-25: 125 mL via INTRAVENOUS

## 2014-06-11 ENCOUNTER — Other Ambulatory Visit (HOSPITAL_COMMUNITY): Payer: Self-pay | Admitting: Oncology

## 2014-06-11 DIAGNOSIS — D6861 Antiphospholipid syndrome: Secondary | ICD-10-CM

## 2014-06-11 MED ORDER — WARFARIN SODIUM 1 MG PO TABS: 1.0000 mg | ORAL_TABLET | Freq: Every day | ORAL | Status: DC

## 2014-07-06 ENCOUNTER — Ambulatory Visit: Payer: BC Managed Care – PPO | Admitting: Neurology

## 2014-07-08 ENCOUNTER — Other Ambulatory Visit: Payer: Self-pay | Admitting: Neurology

## 2014-07-08 NOTE — Telephone Encounter (Signed)
Patient has an appt scheduled

## 2014-07-09 ENCOUNTER — Other Ambulatory Visit (HOSPITAL_COMMUNITY): Payer: Self-pay | Admitting: Oncology

## 2014-07-09 ENCOUNTER — Telehealth (HOSPITAL_COMMUNITY): Payer: Self-pay

## 2014-07-09 DIAGNOSIS — D6861 Antiphospholipid syndrome: Secondary | ICD-10-CM

## 2014-07-09 LAB — PROTIME-INR
INR: 2.8 — AB (ref 0.9–1.1)
Protime: 29.3 seconds

## 2014-07-09 MED ORDER — WARFARIN SODIUM 3 MG PO TABS: 3.0000 mg | ORAL_TABLET | Freq: Every day | ORAL | Status: DC

## 2014-07-09 NOTE — Telephone Encounter (Signed)
Patient instructed to continue coumadin same dosage and to get next PT/INR in 3 - 4 weeks.Samantha Fields understanding.

## 2014-07-13 ENCOUNTER — Other Ambulatory Visit (HOSPITAL_COMMUNITY): Payer: Self-pay | Admitting: Oncology

## 2014-07-13 DIAGNOSIS — D6861 Antiphospholipid syndrome: Secondary | ICD-10-CM

## 2014-07-13 MED ORDER — WARFARIN SODIUM 3 MG PO TABS: 3.0000 mg | ORAL_TABLET | Freq: Every day | ORAL | Status: DC

## 2014-08-01 ENCOUNTER — Ambulatory Visit: Payer: PRIVATE HEALTH INSURANCE | Admitting: Neurology

## 2014-09-14 ENCOUNTER — Ambulatory Visit: Payer: PRIVATE HEALTH INSURANCE | Admitting: Urology

## 2014-10-01 ENCOUNTER — Other Ambulatory Visit (HOSPITAL_COMMUNITY): Payer: Self-pay | Admitting: Oncology

## 2014-10-01 ENCOUNTER — Telehealth (HOSPITAL_COMMUNITY): Payer: Self-pay

## 2014-10-01 DIAGNOSIS — D6861 Antiphospholipid syndrome: Secondary | ICD-10-CM

## 2014-10-01 MED ORDER — WARFARIN SODIUM 1 MG PO TABS: 1.0000 mg | ORAL_TABLET | Freq: Every day | ORAL | Status: DC

## 2014-10-01 NOTE — Telephone Encounter (Signed)
Patient notified and orders faxed.

## 2014-10-01 NOTE — Telephone Encounter (Signed)
Call from patient states  "I need an order for my lab work, I've been to [facility] twice and have not been able to get my blood work done, also I need warfarin 1 mg prescription sent to my pharmacy"  Discussed with Robynn Pane, PA-C.

## 2014-10-04 NOTE — Telephone Encounter (Signed)
error 

## 2014-10-08 ENCOUNTER — Other Ambulatory Visit: Payer: Self-pay | Admitting: Neurology

## 2014-10-14 ENCOUNTER — Other Ambulatory Visit: Payer: Self-pay | Admitting: Neurology

## 2014-10-15 ENCOUNTER — Telehealth: Payer: Self-pay | Admitting: Neurology

## 2014-10-15 ENCOUNTER — Encounter (HOSPITAL_COMMUNITY): Payer: Self-pay

## 2014-10-15 MED ORDER — TOPAMAX 25 MG PO TABS: ORAL_TABLET | ORAL | Status: DC

## 2014-10-15 NOTE — Telephone Encounter (Signed)
Rx sent to last until appt 

## 2014-10-15 NOTE — Telephone Encounter (Signed)
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

## 2014-10-22 ENCOUNTER — Other Ambulatory Visit (HOSPITAL_COMMUNITY): Payer: Self-pay

## 2014-10-22 DIAGNOSIS — D6861 Antiphospholipid syndrome: Secondary | ICD-10-CM

## 2014-10-22 DIAGNOSIS — E611 Iron deficiency: Secondary | ICD-10-CM

## 2014-10-25 ENCOUNTER — Encounter: Payer: Self-pay | Admitting: Neurology

## 2014-10-25 ENCOUNTER — Ambulatory Visit (INDEPENDENT_AMBULATORY_CARE_PROVIDER_SITE_OTHER): Payer: PRIVATE HEALTH INSURANCE | Admitting: Neurology

## 2014-10-25 VITALS — BP 110/70 | HR 74 | Ht 65.0 in | Wt 190.0 lb

## 2014-10-25 DIAGNOSIS — D6852 Prothrombin gene mutation: Secondary | ICD-10-CM

## 2014-10-25 DIAGNOSIS — E611 Iron deficiency: Secondary | ICD-10-CM

## 2014-10-25 DIAGNOSIS — D6859 Other primary thrombophilia: Secondary | ICD-10-CM

## 2014-10-25 DIAGNOSIS — R569 Unspecified convulsions: Secondary | ICD-10-CM

## 2014-10-25 MED ORDER — TOPIRAMATE 50 MG PO TABS: 50.0000 mg | ORAL_TABLET | Freq: Two times a day (BID) | ORAL | Status: DC
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
## 2014-10-28 NOTE — Progress Notes (Signed)
-  Rescheduled-  KEFALAS,THOMAS  

## 2014-10-31 ENCOUNTER — Ambulatory Visit (HOSPITAL_COMMUNITY): Payer: PRIVATE HEALTH INSURANCE | Admitting: Oncology

## 2014-10-31 ENCOUNTER — Other Ambulatory Visit (HOSPITAL_COMMUNITY): Payer: PRIVATE HEALTH INSURANCE
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
## 2014-11-05 ENCOUNTER — Encounter (HOSPITAL_COMMUNITY): Payer: PRIVATE HEALTH INSURANCE

## 2014-11-05 ENCOUNTER — Encounter (HOSPITAL_COMMUNITY): Payer: PRIVATE HEALTH INSURANCE | Attending: Oncology | Admitting: Oncology

## 2014-11-05 ENCOUNTER — Encounter (HOSPITAL_COMMUNITY): Payer: Self-pay | Admitting: Oncology

## 2014-11-05 VITALS — BP 105/66 | HR 76 | Temp 98.0°F | Resp 16 | Wt 193.4 lb

## 2014-11-05 DIAGNOSIS — E611 Iron deficiency: Secondary | ICD-10-CM

## 2014-11-05 DIAGNOSIS — D6861 Antiphospholipid syndrome: Secondary | ICD-10-CM | POA: Insufficient documentation

## 2014-11-05 DIAGNOSIS — D6852 Prothrombin gene mutation: Secondary | ICD-10-CM | POA: Insufficient documentation

## 2014-11-05 DIAGNOSIS — D6859 Other primary thrombophilia: Secondary | ICD-10-CM

## 2014-11-05 DIAGNOSIS — Z23 Encounter for immunization: Secondary | ICD-10-CM

## 2014-11-05 LAB — CBC WITH DIFFERENTIAL/PLATELET
BASOS PCT: 1 % (ref 0–1)
Basophils Absolute: 0.1 10*3/uL (ref 0.0–0.1)
EOS ABS: 0.1 10*3/uL (ref 0.0–0.7)
Eosinophils Relative: 2 % (ref 0–5)
HCT: 41.7 % (ref 36.0–46.0)
HEMOGLOBIN: 13.3 g/dL (ref 12.0–15.0)
Lymphocytes Relative: 21 % (ref 12–46)
Lymphs Abs: 1.4 10*3/uL (ref 0.7–4.0)
MCH: 28.4 pg (ref 26.0–34.0)
MCHC: 31.9 g/dL (ref 30.0–36.0)
MCV: 88.9 fL (ref 78.0–100.0)
MONOS PCT: 7 % (ref 3–12)
Monocytes Absolute: 0.5 10*3/uL (ref 0.1–1.0)
NEUTROS PCT: 69 % (ref 43–77)
Neutro Abs: 4.7 10*3/uL (ref 1.7–7.7)
Platelets: 242 10*3/uL (ref 150–400)
RBC: 4.69 MIL/uL (ref 3.87–5.11)
RDW: 14.7 % (ref 11.5–15.5)
WBC: 6.8 10*3/uL (ref 4.0–10.5)

## 2014-11-05 LAB — PROTIME-INR
INR: 2.93 — AB (ref 0.00–1.49)
Prothrombin Time: 30.8 seconds — ABNORMAL HIGH (ref 11.6–15.2)

## 2014-11-05 MED ORDER — INFLUENZA VAC SPLIT QUAD 0.5 ML IM SUSY
0.5000 mL | PREFILLED_SYRINGE | Freq: Once | INTRAMUSCULAR | Status: AC
  Administered 2014-11-05: 0.5 mL via INTRAMUSCULAR
  Filled 2014-11-05: qty 0.5

## 2014-11-05 NOTE — Patient Instructions (Signed)
[facility] Discharge Instructions  RECOMMENDATIONS MADE BY THE CONSULTANT AND ANY TEST RESULTS WILL BE SENT TO YOUR REFERRING PHYSICIAN.  Labs today and in 6 months. Follow-up with primary care provider as directed Return in 6 months for follow-up. Continue with INR every 4 weeks.  Please call the [facility] with any questions or concerns.  Please call with any unstoppable bleeding or report to ED after business hours.  Thank you for choosing [facility] to provide your oncology and hematology care.  To afford each patient quality time with our providers, please arrive at least 15 minutes before your scheduled appointment time.  With your help, our goal is to use those 15 minutes to complete the necessary work-up to ensure our physicians have the information they need to help with your evaluation and healthcare recommendations.    Effective January 1st, 2014, we ask that you re-schedule your appointment with our physicians should you arrive 10 or more minutes late for your appointment.  We strive to give you quality time with our providers, and arriving late affects you and other patients whose appointments are after yours.    Again, thank you for choosing [facility].  Our hope is that these requests will decrease the amount of time that you wait before being seen by our physicians.       _____________________________________________________________  Should you have questions after your visit to [facility], please contact our office at [phone] between the hours of 8:30 a.m. and 5:00 p.m.  Voicemails left after 4:30 p.m. will not be returned until the following business day.  For prescription refill requests, have your pharmacy contact our office with your prescription refill request.

## 2014-11-05 NOTE — Progress Notes (Signed)
Samantha Fields presents today for injection per MD orders. Flu vaccine administered SQ in left deltoid. Administration without incident. Patient tolerated well.  Samantha Fields presented for labwork. Labs per MD order drawn via Peripheral Line 23 gauge needle inserted in right AC  Good blood return present. Procedure without incident.  Needle removed intact. Patient tolerated procedure well.

## 2014-11-12 ENCOUNTER — Telehealth (HOSPITAL_COMMUNITY): Payer: Self-pay

## 2014-11-12 ENCOUNTER — Other Ambulatory Visit (HOSPITAL_COMMUNITY): Payer: Self-pay | Admitting: Hematology and Oncology

## 2014-11-12 DIAGNOSIS — D509 Iron deficiency anemia, unspecified: Secondary | ICD-10-CM

## 2014-11-12 LAB — FERRITIN: Ferritin: 17 ng/mL (ref 10–291)

## 2014-11-12 NOTE — Telephone Encounter (Signed)
Error

## 2014-11-12 NOTE — Telephone Encounter (Signed)
-----   Message from Baird Cancer, PA-C sent at 11/12/2014  1:31 PM EST ----- Her Hgb is WNL but her ferritin is low at 17.  She should come back for Stool Cards x 3 and be referred to GI of her choosing for iron deficiency.  Is she still taking Niferex?  If not, she should restart.  KEFALAS,THOMAS

## 2014-11-12 NOTE — Telephone Encounter (Signed)
Message left for Samantha Fields to call office to discuss.

## 2014-11-13 ENCOUNTER — Encounter (HOSPITAL_COMMUNITY): Payer: Self-pay

## 2014-11-13 NOTE — Telephone Encounter (Signed)
Unable to reach by phone and letter was sent.

## 2014-11-13 NOTE — Telephone Encounter (Signed)
-----   Message from Baird Cancer, PA-C sent at 11/12/2014  1:31 PM EST ----- Her Hgb is WNL but her ferritin is low at 17.  She should come back for Stool Cards x 3 and be referred to GI of her choosing for iron deficiency.  Is she still taking Niferex?  If not, she should restart.  KEFALAS,THOMAS

## 2014-11-19 ENCOUNTER — Other Ambulatory Visit (HOSPITAL_COMMUNITY): Payer: Self-pay | Admitting: Oncology

## 2014-11-19 ENCOUNTER — Telehealth (HOSPITAL_COMMUNITY): Payer: Self-pay

## 2014-11-19 DIAGNOSIS — E611 Iron deficiency: Secondary | ICD-10-CM

## 2014-11-19 MED ORDER — POLYSACCHARIDE IRON COMPLEX 150 MG PO CAPS: 150.0000 mg | ORAL_CAPSULE | Freq: Every day | ORAL | Status: DC

## 2014-11-19 NOTE — Telephone Encounter (Signed)
Spoke with Shavell, instructed to collect stool cards x 3 for blood (different days) and to bring samples back to clinic. Per Robynn Pane, PA-C not to restart iron until cards are brought in and wiill decide what to do regarding GI consult once we have the results from the cards.

## 2014-11-19 NOTE — Telephone Encounter (Signed)
Cache wants to know what her ferritin level should be and if she could come for iron infusion if needed?  Also questions need for GI consult, wants to know why she needs to go.  Would like refills for her niferex be called to her pharmacy.  Will send husband by to pick-up hemoccult cards.

## 2014-11-19 NOTE — Telephone Encounter (Signed)
Discussed with [name], RN.  Will perform stool cards.  Patient is not taking her iron pills as prescribed.  She is to hold iron until after stool cards ar collected.  Then restart iron pills.  New Rx escribed to [facility].  We will repeat labs in 2 months to evaluate for response.  If stool cards are positive, then we will need to refer to GI for work-up.  Samantha Fields 11/19/2014

## 2014-11-29 ENCOUNTER — Encounter (HOSPITAL_COMMUNITY): Payer: Self-pay

## 2014-11-29 DIAGNOSIS — E611 Iron deficiency: Secondary | ICD-10-CM

## 2014-11-29 LAB — OCCULT BLOOD X 1 CARD TO LAB, STOOL
Fecal Occult Bld: NEGATIVE
Fecal Occult Bld: NEGATIVE
Fecal Occult Bld: NEGATIVE

## 2014-12-31 ENCOUNTER — Telehealth (HOSPITAL_COMMUNITY): Payer: Self-pay | Admitting: *Deleted

## 2014-12-31 ENCOUNTER — Other Ambulatory Visit (HOSPITAL_COMMUNITY): Payer: Self-pay | Admitting: Oncology

## 2014-12-31 LAB — PROTIME-INR: INR: 1.8 — AB (ref 0.9–1.1)

## 2014-12-31 NOTE — Telephone Encounter (Signed)
Patient notified to take 4 mg coumadin x 3 days then go back to 3.5 mg alternating with 4 mg. PT INR in 2 weeks.

## 2015-01-09 ENCOUNTER — Other Ambulatory Visit: Payer: Self-pay | Admitting: Adult Health

## 2015-01-10 ENCOUNTER — Other Ambulatory Visit: Payer: Self-pay | Admitting: Adult Health

## 2015-01-10 DIAGNOSIS — Z1231 Encounter for screening mammogram for malignant neoplasm of breast: Secondary | ICD-10-CM

## 2015-01-14 ENCOUNTER — Ambulatory Visit (HOSPITAL_COMMUNITY)
Admission: RE | Admit: 2015-01-14 | Discharge: 2015-01-14 | Disposition: A | Discharge status: Home / Self Care | Payer: PRIVATE HEALTH INSURANCE | Source: Ambulatory Visit | Attending: Adult Health | Admitting: Adult Health

## 2015-01-14 DIAGNOSIS — R928 Other abnormal and inconclusive findings on diagnostic imaging of breast: Secondary | ICD-10-CM | POA: Diagnosis not present

## 2015-01-14 DIAGNOSIS — Z1231 Encounter for screening mammogram for malignant neoplasm of breast: Secondary | ICD-10-CM | POA: Diagnosis not present

## 2015-01-16 ENCOUNTER — Encounter: Payer: Self-pay | Admitting: Oncology

## 2015-01-17 ENCOUNTER — Other Ambulatory Visit: Payer: Self-pay | Admitting: Adult Health

## 2015-01-17 DIAGNOSIS — R928 Other abnormal and inconclusive findings on diagnostic imaging of breast: Secondary | ICD-10-CM

## 2015-01-21 ENCOUNTER — Other Ambulatory Visit (HOSPITAL_COMMUNITY): Payer: PRIVATE HEALTH INSURANCE

## 2015-01-22 ENCOUNTER — Ambulatory Visit
Admission: RE | Admit: 2015-01-22 | Discharge: 2015-01-22 | Disposition: A | Discharge status: Home / Self Care | Payer: PRIVATE HEALTH INSURANCE | Source: Ambulatory Visit | Attending: Adult Health | Admitting: Adult Health

## 2015-01-22 DIAGNOSIS — R928 Other abnormal and inconclusive findings on diagnostic imaging of breast: Secondary | ICD-10-CM

## 2015-01-30 ENCOUNTER — Telehealth (HOSPITAL_COMMUNITY): Payer: Self-pay

## 2015-01-30 ENCOUNTER — Other Ambulatory Visit (HOSPITAL_COMMUNITY): Payer: Self-pay

## 2015-01-30 LAB — PROTIME-INR
INR: 1.8 — AB (ref 0.9–1.1)
PROTIME: 20.8 s

## 2015-01-30 NOTE — Telephone Encounter (Signed)
Per Dr. Whitney Muse, patient notified to increase her coumadin to 4 mg daily and to get PT/INR drawn in 2 weeks.   Verbalized understanding of instructions.

## 2015-02-05 ENCOUNTER — Other Ambulatory Visit: Payer: Self-pay | Admitting: Adult Health

## 2015-02-18 ENCOUNTER — Telehealth (HOSPITAL_COMMUNITY): Payer: Self-pay

## 2015-02-18 LAB — PROTIME-INR: INR: 2.3 — AB (ref 0.9–1.1)

## 2015-02-18 NOTE — Telephone Encounter (Signed)
Patient instructed to continue coumadin same dosage and to return for next PT/INR in 4 weeks.  Verbalizes understanding.

## 2015-04-04 ENCOUNTER — Other Ambulatory Visit (HOSPITAL_COMMUNITY): Payer: Self-pay | Admitting: Oncology

## 2015-04-04 DIAGNOSIS — D6861 Antiphospholipid syndrome: Secondary | ICD-10-CM

## 2015-04-04 MED ORDER — WARFARIN SODIUM 1 MG PO TABS: 1.0000 mg | ORAL_TABLET | Freq: Every day | ORAL | Status: DC

## 2015-04-11 ENCOUNTER — Telehealth (HOSPITAL_COMMUNITY): Payer: Self-pay

## 2015-04-11 LAB — PROTIME-INR
INR: 2.7 — AB (ref 0.9–1.1)
Protime: 28.5 seconds

## 2015-04-11 NOTE — Telephone Encounter (Signed)
Message left for patient and  instructed to continue same dosage of coumadin and to get next PT/INR repeated in 4 weeks.  Call back confirmation requested.

## 2015-05-06 ENCOUNTER — Other Ambulatory Visit (HOSPITAL_COMMUNITY): Payer: PRIVATE HEALTH INSURANCE

## 2015-05-06 ENCOUNTER — Ambulatory Visit (HOSPITAL_COMMUNITY): Payer: PRIVATE HEALTH INSURANCE | Admitting: Oncology

## 2015-05-14 ENCOUNTER — Other Ambulatory Visit (HOSPITAL_COMMUNITY): Payer: PRIVATE HEALTH INSURANCE

## 2015-05-14 ENCOUNTER — Ambulatory Visit (HOSPITAL_COMMUNITY): Payer: PRIVATE HEALTH INSURANCE | Admitting: Oncology

## 2015-05-15 ENCOUNTER — Telehealth (HOSPITAL_COMMUNITY): Payer: Self-pay | Admitting: Emergency Medicine

## 2015-05-15 ENCOUNTER — Encounter (HOSPITAL_COMMUNITY): Payer: PRIVATE HEALTH INSURANCE

## 2015-05-15 ENCOUNTER — Encounter (HOSPITAL_COMMUNITY): Payer: Self-pay | Admitting: Oncology

## 2015-05-15 ENCOUNTER — Encounter (HOSPITAL_COMMUNITY): Payer: PRIVATE HEALTH INSURANCE | Attending: Oncology | Admitting: Oncology

## 2015-05-15 ENCOUNTER — Other Ambulatory Visit (HOSPITAL_COMMUNITY): Payer: Self-pay | Admitting: Oncology

## 2015-05-15 VITALS — BP 127/86 | HR 81 | Temp 97.8°F | Resp 20 | Wt 190.8 lb

## 2015-05-15 DIAGNOSIS — Z7901 Long term (current) use of anticoagulants: Secondary | ICD-10-CM

## 2015-05-15 DIAGNOSIS — E611 Iron deficiency: Secondary | ICD-10-CM

## 2015-05-15 DIAGNOSIS — D6861 Antiphospholipid syndrome: Secondary | ICD-10-CM

## 2015-05-15 DIAGNOSIS — E876 Hypokalemia: Secondary | ICD-10-CM

## 2015-05-15 DIAGNOSIS — D6859 Other primary thrombophilia: Secondary | ICD-10-CM

## 2015-05-15 LAB — CBC WITH DIFFERENTIAL/PLATELET
BASOS PCT: 1 % (ref 0–1)
Basophils Absolute: 0.1 10*3/uL (ref 0.0–0.1)
Eosinophils Absolute: 0.1 10*3/uL (ref 0.0–0.7)
Eosinophils Relative: 1 % (ref 0–5)
HEMATOCRIT: 37.1 % (ref 36.0–46.0)
Hemoglobin: 11.8 g/dL — ABNORMAL LOW (ref 12.0–15.0)
LYMPHS ABS: 1 10*3/uL (ref 0.7–4.0)
LYMPHS PCT: 16 % (ref 12–46)
MCH: 28.3 pg (ref 26.0–34.0)
MCHC: 31.8 g/dL (ref 30.0–36.0)
MCV: 89 fL (ref 78.0–100.0)
MONO ABS: 0.3 10*3/uL (ref 0.1–1.0)
MONOS PCT: 5 % (ref 3–12)
Neutro Abs: 4.9 10*3/uL (ref 1.7–7.7)
Neutrophils Relative %: 77 % (ref 43–77)
Platelets: 234 10*3/uL (ref 150–400)
RBC: 4.17 MIL/uL (ref 3.87–5.11)
RDW: 14.9 % (ref 11.5–15.5)
WBC: 6.4 10*3/uL (ref 4.0–10.5)

## 2015-05-15 LAB — COMPREHENSIVE METABOLIC PANEL
ALT: 24 U/L (ref 14–54)
AST: 31 U/L (ref 15–41)
Albumin: 3.6 g/dL (ref 3.5–5.0)
Alkaline Phosphatase: 94 U/L (ref 38–126)
Anion gap: 10 (ref 5–15)
BUN: 20 mg/dL (ref 6–20)
CALCIUM: 8.8 mg/dL — AB (ref 8.9–10.3)
CO2: 27 mmol/L (ref 22–32)
CREATININE: 0.98 mg/dL (ref 0.44–1.00)
Chloride: 97 mmol/L — ABNORMAL LOW (ref 101–111)
GFR calc Af Amer: >60 mL/min (ref >60)
GFR calc non Af Amer: >60 mL/min (ref >60)
Glucose, Bld: 132 mg/dL — ABNORMAL HIGH (ref 65–99)
Potassium: 2.7 mmol/L — CL (ref 3.5–5.1)
Sodium: 134 mmol/L — ABNORMAL LOW (ref 135–145)
Total Bilirubin: 1 mg/dL (ref 0.3–1.2)
Total Protein: 6.6 g/dL (ref 6.5–8.1)

## 2015-05-15 LAB — IRON AND TIBC
IRON: 61 ug/dL (ref 28–170)
SATURATION RATIOS: 14 % (ref 10.4–31.8)
TIBC: 431 ug/dL (ref 250–450)
UIBC: 370 ug/dL

## 2015-05-15 LAB — FERRITIN: Ferritin: 33 ng/mL (ref 11–307)

## 2015-05-15 MED ORDER — POTASSIUM CHLORIDE CRYS ER 20 MEQ PO TBCR: 20.0000 meq | EXTENDED_RELEASE_TABLET | Freq: Two times a day (BID) | ORAL | Status: DC

## 2015-05-15 NOTE — Patient Instructions (Signed)
[place] at [facility] Discharge Instructions  RECOMMENDATIONS MADE BY THE CONSULTANT AND ANY TEST RESULTS WILL BE SENT TO YOUR REFERRING PHYSICIAN.  You saw Gershon Mussel the PA today. Continue your iron pill and coumadin. Labs in 72months. Please check you INR every 4weeks at [facility]. Return in 60months for follow up with Dr. Whitney Muse  Thank you for choosing [place] at [facility] to provide your oncology and hematology care.  To afford each patient quality time with our provider, please arrive at least 15 minutes before your scheduled appointment time.    You need to re-schedule your appointment should you arrive 10 or more minutes late.  We strive to give you quality time with our providers, and arriving late affects you and other patients whose appointments are after yours.  Also, if you no show three or more times for appointments you may be dismissed from the clinic at the providers discretion.     Again, thank you for choosing [facility].  Our hope is that these requests will decrease the amount of time that you wait before being seen by our physicians.       _____________________________________________________________  Should you have questions after your visit to [facility], please contact our office at [phone] between the hours of 8:30 a.m. and 4:30 p.m.  Voicemails left after 4:30 p.m. will not be returned until the following business day.  For prescription refill requests, have your pharmacy contact our office.

## 2015-05-15 NOTE — Progress Notes (Signed)
Samantha Fields's reason for visit today is for labs as scheduled per MD orders.  Venipuncture performed with a 23 gauge butterfly needle to R Antecubital.  Samantha Fields tolerated procedure well and without incident; questions were answered and patient was discharged.

## 2015-05-15 NOTE — Progress Notes (Signed)
CRITICAL VALUE ALERT Critical value received:  Potassium 2.7 Date of notification:  05/15/2015 Time of notification: 3383 Critical value read back:  Yes.   Nurse who received alert:  Shellia Carwin RN MD notified (1st page):  Kirby Crigler P-AC
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
## 2015-05-15 NOTE — Assessment & Plan Note (Addendum)
Thrombophilia manifesting as antiphospholipid antibody, tolerating warfarin well.   Continue Coumadin with INR monitoring as directed.   Return in 6 months for follow-up.

## 2015-05-15 NOTE — Telephone Encounter (Signed)
-----   Message from Baird Cancer, PA-C sent at 05/15/2015  3:49 PM EDT ----- Kdur 20 mEq BID escribed x 30 days.  Will defer follow-up to primary care physician since she is on HCTZ.

## 2015-05-15 NOTE — Telephone Encounter (Signed)
Called notified pt that called potassium to pharmacy and to follow up with PCP

## 2015-05-22 ENCOUNTER — Other Ambulatory Visit (HOSPITAL_COMMUNITY): Payer: Self-pay | Admitting: Oncology

## 2015-05-22 DIAGNOSIS — E611 Iron deficiency: Secondary | ICD-10-CM

## 2015-05-27 ENCOUNTER — Telehealth (HOSPITAL_COMMUNITY): Payer: Self-pay | Admitting: *Deleted

## 2015-05-27 DIAGNOSIS — D6861 Antiphospholipid syndrome: Secondary | ICD-10-CM

## 2015-05-27 NOTE — Telephone Encounter (Signed)
Patient contacted about iron studies and would like to have iron infusion. Scheduled for Friday. She will need INR checked as well. Reports large bruise on her leg. Suggested that she come in today for INR but patient requests to check on Friday (this is her day off work)

## 2015-05-31 ENCOUNTER — Encounter (HOSPITAL_COMMUNITY): Payer: Self-pay

## 2015-05-31 ENCOUNTER — Encounter (HOSPITAL_BASED_OUTPATIENT_CLINIC_OR_DEPARTMENT_OTHER): Payer: PRIVATE HEALTH INSURANCE

## 2015-05-31 ENCOUNTER — Telehealth (HOSPITAL_COMMUNITY): Payer: Self-pay

## 2015-05-31 VITALS — BP 101/53 | HR 64 | Temp 98.2°F | Resp 18

## 2015-05-31 DIAGNOSIS — D6861 Antiphospholipid syndrome: Secondary | ICD-10-CM

## 2015-05-31 DIAGNOSIS — D509 Iron deficiency anemia, unspecified: Secondary | ICD-10-CM

## 2015-05-31 DIAGNOSIS — E611 Iron deficiency: Secondary | ICD-10-CM

## 2015-05-31 LAB — PROTIME-INR
INR: 2.73 — AB (ref 0.00–1.49)
Prothrombin Time: 28.5 seconds — ABNORMAL HIGH (ref 11.6–15.2)

## 2015-05-31 MED ORDER — SODIUM CHLORIDE 0.9 % IV SOLN
INTRAVENOUS | Status: DC
  Administered 2015-05-31: 12:00:00 via INTRAVENOUS

## 2015-05-31 MED ORDER — SODIUM CHLORIDE 0.9 % IV SOLN
125.0000 mg | Freq: Once | INTRAVENOUS | Status: AC
  Administered 2015-05-31: 125 mg via INTRAVENOUS
  Filled 2015-05-31: qty 10

## 2015-05-31 NOTE — Progress Notes (Signed)
Valina E Litzinger Tolerated iron infusion well Discharged ambulatory

## 2015-05-31 NOTE — Telephone Encounter (Signed)
Message left for patient to continue same dosage of coumadin and to get next PT/INR in 4 weeks.  Order will be faxed to [facility] for PRN PT/INR.  Call back confirmation requested.

## 2015-05-31 NOTE — Progress Notes (Signed)
Lab draw

## 2015-05-31 NOTE — Patient Instructions (Signed)
[facility] at [facility] Discharge Instructions  RECOMMENDATIONS MADE BY THE CONSULTANT AND ANY TEST RESULTS WILL BE SENT TO YOUR REFERRING PHYSICIAN.  Iron infusion today Follow up as scheduled Please call the clinic if you have any questions or concerns  Thank you for choosing [facility] at [facility] to provide your oncology and hematology care.  To afford each patient quality time with our provider, please arrive at least 15 minutes before your scheduled appointment time.    You need to re-schedule your appointment should you arrive 10 or more minutes late.  We strive to give you quality time with our providers, and arriving late affects you and other patients whose appointments are after yours.  Also, if you no show three or more times for appointments you may be dismissed from the clinic at the providers discretion.     Again, thank you for choosing [facility].  Our hope is that these requests will decrease the amount of time that you wait before being seen by our physicians.       _____________________________________________________________  Should you have questions after your visit to [facility], please contact our office at [phone] between the hours of 8:30 a.m. and 4:30 p.m.  Voicemails left after 4:30 p.m. will not be returned until the following business day.  For prescription refill requests, have your pharmacy contact our office.    

## 2015-06-03 NOTE — Telephone Encounter (Signed)
Call back confirmation that message was received.  Is taking coumadin 3.5mg , 3.5mg , 4mg , etc.

## 2015-06-28 ENCOUNTER — Other Ambulatory Visit (HOSPITAL_COMMUNITY): Payer: Self-pay | Admitting: Oncology

## 2015-06-28 DIAGNOSIS — E611 Iron deficiency: Secondary | ICD-10-CM

## 2015-06-28 MED ORDER — POLYSACCHARIDE IRON COMPLEX 150 MG PO CAPS: 150.0000 mg | ORAL_CAPSULE | Freq: Every day | ORAL | Status: DC

## 2015-07-02 ENCOUNTER — Other Ambulatory Visit (HOSPITAL_COMMUNITY): Payer: Self-pay | Admitting: Oncology

## 2015-07-02 DIAGNOSIS — E611 Iron deficiency: Secondary | ICD-10-CM

## 2015-07-02 MED ORDER — POLYSACCHARIDE IRON COMPLEX 150 MG PO CAPS: 150.0000 mg | ORAL_CAPSULE | Freq: Every day | ORAL | Status: DC

## 2015-07-04 ENCOUNTER — Telehealth (HOSPITAL_COMMUNITY): Payer: Self-pay

## 2015-07-04 LAB — PROTIME-INR
INR: 2.4 — AB (ref 0.9–1.1)
Protime: 26 seconds

## 2015-07-04 NOTE — Telephone Encounter (Signed)
Patient instructed to continue coumadin 3.5mg , 3.5mg , 4mg ,etc and to return for next PT/INR in 4 weeks.  Verbalizes understanding.

## 2015-07-13 ENCOUNTER — Other Ambulatory Visit: Payer: Self-pay | Admitting: Adult Health

## 2015-07-20 ENCOUNTER — Other Ambulatory Visit (HOSPITAL_COMMUNITY): Payer: Self-pay | Admitting: Oncology

## 2015-08-24 LAB — PROTIME-INR: INR: 1.5 — AB (ref 0.9–1.1)

## 2015-08-26 ENCOUNTER — Telehealth (HOSPITAL_COMMUNITY): Payer: Self-pay | Admitting: *Deleted

## 2015-08-26 NOTE — Telephone Encounter (Signed)
Patient notified of changes in coumadin dose and f/u labs

## 2015-08-26 NOTE — Telephone Encounter (Signed)
Patient reports that she missed one dose, 2 days prior to lab draw. She also needs new order sent to lab.

## 2015-09-16 ENCOUNTER — Other Ambulatory Visit (HOSPITAL_COMMUNITY): Payer: Self-pay

## 2015-09-16 DIAGNOSIS — D6861 Antiphospholipid syndrome: Secondary | ICD-10-CM

## 2015-09-23 ENCOUNTER — Telehealth (HOSPITAL_COMMUNITY): Payer: Self-pay

## 2015-09-23 LAB — PROTIME-INR
INR: 2.3 — AB (ref 0.9–1.1)
Protime: 25.9 seconds

## 2015-09-23 NOTE — Telephone Encounter (Signed)
Patient instructed to continue coumadin 3.5 mg, 3.24m , 4mg  repeating and to return for next PT/INR in 3 weeks.  Verbalizes understanding of instructions.

## 2015-10-31 ENCOUNTER — Ambulatory Visit: Payer: Self-pay | Admitting: Neurology

## 2015-11-14 ENCOUNTER — Ambulatory Visit (HOSPITAL_COMMUNITY)
Admission: RE | Admit: 2015-11-14 | Discharge: 2015-11-14 | Disposition: A | Discharge status: Home / Self Care | Payer: PRIVATE HEALTH INSURANCE | Source: Ambulatory Visit | Attending: Oncology | Admitting: Oncology

## 2015-11-14 ENCOUNTER — Encounter (HOSPITAL_COMMUNITY): Payer: Self-pay | Admitting: Oncology

## 2015-11-14 ENCOUNTER — Encounter (HOSPITAL_COMMUNITY): Payer: PRIVATE HEALTH INSURANCE | Attending: Oncology | Admitting: Oncology

## 2015-11-14 ENCOUNTER — Other Ambulatory Visit (HOSPITAL_COMMUNITY): Payer: PRIVATE HEALTH INSURANCE

## 2015-11-14 ENCOUNTER — Encounter (HOSPITAL_COMMUNITY): Payer: PRIVATE HEALTH INSURANCE

## 2015-11-14 VITALS — BP 96/70 | HR 81 | Temp 98.3°F | Resp 16 | Wt 190.4 lb

## 2015-11-14 DIAGNOSIS — D6861 Antiphospholipid syndrome: Secondary | ICD-10-CM

## 2015-11-14 DIAGNOSIS — R0781 Pleurodynia: Secondary | ICD-10-CM

## 2015-11-14 DIAGNOSIS — E611 Iron deficiency: Secondary | ICD-10-CM | POA: Insufficient documentation

## 2015-11-14 DIAGNOSIS — R0602 Shortness of breath: Secondary | ICD-10-CM | POA: Insufficient documentation

## 2015-11-14 DIAGNOSIS — T457X5S Adverse effect of anticoagulant antagonists, vitamin K and other coagulants, sequela: Secondary | ICD-10-CM

## 2015-11-14 DIAGNOSIS — S2232XA Fracture of one rib, left side, initial encounter for closed fracture: Secondary | ICD-10-CM

## 2015-11-14 LAB — CBC WITH DIFFERENTIAL/PLATELET
BASOS ABS: 0.1 10*3/uL (ref 0.0–0.1)
Basophils Relative: 2 %
Eosinophils Absolute: 0.2 10*3/uL (ref 0.0–0.7)
Eosinophils Relative: 4 %
HEMATOCRIT: 42.6 % (ref 36.0–46.0)
Hemoglobin: 13.6 g/dL (ref 12.0–15.0)
LYMPHS ABS: 1.3 10*3/uL (ref 0.7–4.0)
Lymphocytes Relative: 22 %
MCH: 29.1 pg (ref 26.0–34.0)
MCHC: 31.9 g/dL (ref 30.0–36.0)
MCV: 91 fL (ref 78.0–100.0)
Monocytes Absolute: 0.4 10*3/uL (ref 0.1–1.0)
Monocytes Relative: 6 %
NEUTROS ABS: 4.1 10*3/uL (ref 1.7–7.7)
Neutrophils Relative %: 66 %
Platelets: 244 10*3/uL (ref 150–400)
RBC: 4.68 MIL/uL (ref 3.87–5.11)
RDW: 15.1 % (ref 11.5–15.5)
WBC: 6.1 10*3/uL (ref 4.0–10.5)

## 2015-11-14 LAB — IRON AND TIBC
Iron: 40 ug/dL (ref 28–170)
SATURATION RATIOS: 8 % — AB (ref 10.4–31.8)
TIBC: 483 ug/dL — ABNORMAL HIGH (ref 250–450)
UIBC: 443 ug/dL

## 2015-11-14 LAB — PROTIME-INR
INR: 2.81 — AB (ref 0.00–1.49)
PROTHROMBIN TIME: 29.2 s — AB (ref 11.6–15.2)

## 2015-11-14 LAB — FERRITIN: Ferritin: 11 ng/mL (ref 11–307)

## 2015-11-14 MED ORDER — POLYSACCHARIDE IRON COMPLEX 150 MG PO CAPS: 150.0000 mg | ORAL_CAPSULE | Freq: Every day | ORAL | Status: DC

## 2015-11-14 NOTE — Patient Instructions (Signed)
[place] at [facility] Discharge Instructions  RECOMMENDATIONS MADE BY THE CONSULTANT AND ANY TEST RESULTS WILL BE SENT TO YOUR REFERRING PHYSICIAN.  Exam and discussion by Robynn Pane, PA-C Will let you know about your PT level Niferex e-scribed Go by xray on your way out to get Rib xrays  Follow-up in 6 months.  Thank you for choosing [place] at [facility] to provide your oncology and hematology care.  To afford each patient quality time with our provider, please arrive at least 15 minutes before your scheduled appointment time.    You need to re-schedule your appointment should you arrive 10 or more minutes late.  We strive to give you quality time with our providers, and arriving late affects you and other patients whose appointments are after yours.  Also, if you no show three or more times for appointments you may be dismissed from the clinic at the providers discretion.     Again, thank you for choosing [facility].  Our hope is that these requests will decrease the amount of time that you wait before being seen by our physicians.       _____________________________________________________________  Should you have questions after your visit to [facility], please contact our office at [phone] between the hours of 8:30 a.m. and 4:30 p.m.  Voicemails left after 4:30 p.m. will not be returned until the following business day.  For prescription refill requests, have your pharmacy contact our office.
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
## 2015-11-14 NOTE — Assessment & Plan Note (Addendum)
Thrombophilia manifesting as antiphospholipid antibody, tolerating warfarin well.   Continue Coumadin with INR monitoring as directed.   No VTE.  Compliant with vitamin K antagonist.  She reports easy bruising and she suspects her INR is elevated.  Today, her INR is therapeutic.  She has injured her right thorax, suspicious for injury to left ribs.  I have set-up left rib films.  Return in 6 months for follow-up.  Addendum: Left rib series demonstrates a left 10th rib, nondisplaced, fracture.  I will have nursing call in Tramadol for pain control (given her Coumadin).  Additionally, in light of this fracture and long term Vitamin K antagonist use, she needs a bone density exam to evaluate for osteoporosis.  Order is placed and I will ask that this test be scheduled prior to her next follow-up appointment.

## 2015-11-14 NOTE — Assessment & Plan Note (Addendum)
Ferritin 6 months ago was 33 with mild anemia.  She was started on Niferex beginning in June 2016.  Additionally, she was given ferric gluconate 125 mg on 05/31/2015.  She is tolerating well when taken at HS.  She is to continue this medication.  I will refill the prescription.  Will update iron studies today.

## 2015-11-15 ENCOUNTER — Telehealth (HOSPITAL_COMMUNITY): Payer: Self-pay | Admitting: *Deleted

## 2015-11-15 ENCOUNTER — Other Ambulatory Visit (HOSPITAL_COMMUNITY): Payer: Self-pay | Admitting: Oncology

## 2015-11-15 DIAGNOSIS — E611 Iron deficiency: Secondary | ICD-10-CM

## 2015-11-15 MED ORDER — TRAMADOL HCL 50 MG PO TABS: 50.0000 mg | ORAL_TABLET | Freq: Four times a day (QID) | ORAL | Status: DC | PRN

## 2015-11-15 NOTE — Telephone Encounter (Signed)
[facility]am called to [employer]

## 2015-11-15 NOTE — Addendum Note (Signed)
Addended by: Baird Cancer on: 11/15/2015 09:05 AM   Modules accepted: Orders, Level of Service

## 2015-11-19 ENCOUNTER — Other Ambulatory Visit (HOSPITAL_COMMUNITY): Payer: Self-pay

## 2015-11-21 ENCOUNTER — Other Ambulatory Visit: Payer: Self-pay | Admitting: Neurology

## 2015-11-21 ENCOUNTER — Other Ambulatory Visit (HOSPITAL_COMMUNITY): Payer: PRIVATE HEALTH INSURANCE

## 2015-11-22 ENCOUNTER — Telehealth (HOSPITAL_COMMUNITY): Payer: Self-pay | Admitting: Oncology

## 2015-11-22 ENCOUNTER — Encounter (HOSPITAL_BASED_OUTPATIENT_CLINIC_OR_DEPARTMENT_OTHER): Payer: PRIVATE HEALTH INSURANCE

## 2015-11-22 VITALS — BP 104/48 | HR 64 | Temp 98.2°F | Resp 18

## 2015-11-22 DIAGNOSIS — E611 Iron deficiency: Secondary | ICD-10-CM | POA: Diagnosis not present

## 2015-11-22 MED ORDER — SODIUM CHLORIDE 0.9 % IV SOLN
INTRAVENOUS | Status: DC
  Administered 2015-11-22: 14:00:00 via INTRAVENOUS

## 2015-11-22 MED ORDER — SODIUM CHLORIDE 0.9 % IV SOLN
125.0000 mg | Freq: Once | INTRAVENOUS | Status: AC
  Administered 2015-11-22: 125 mg via INTRAVENOUS
  Filled 2015-11-22: qty 10

## 2015-11-22 MED ORDER — SODIUM CHLORIDE 0.9 % IJ SOLN
10.0000 mL | Freq: Once | INTRAMUSCULAR | Status: AC
  Administered 2015-11-22: 10 mL via INTRAVENOUS

## 2015-11-22 NOTE — Telephone Encounter (Signed)
Per August Saucer vm iron infusions do not require pre auth

## 2015-11-22 NOTE — Progress Notes (Signed)
Tolerated iron infusion well. Ambulatory on discharge home to self. 

## 2015-11-22 NOTE — Telephone Encounter (Signed)
Per August Saucer iron infusions do not require auth

## 2015-11-22 NOTE — Patient Instructions (Signed)
[place] at [facility] Discharge Instructions  RECOMMENDATIONS MADE BY THE CONSULTANT AND ANY TEST RESULTS WILL BE SENT TO YOUR REFERRING PHYSICIAN.  Ferric gluconate 125 mg iron infusion given today as ordered. Continue iron pills by mouth. Return as scheduled.  Thank you for choosing [place] at [facility] to provide your oncology and hematology care.  To afford each patient quality time with our provider, please arrive at least 15 minutes before your scheduled appointment time.    You need to re-schedule your appointment should you arrive 10 or more minutes late.  We strive to give you quality time with our providers, and arriving late affects you and other patients whose appointments are after yours.  Also, if you no show three or more times for appointments you may be dismissed from the clinic at the providers discretion.     Again, thank you for choosing [facility].  Our hope is that these requests will decrease the amount of time that you wait before being seen by our physicians.       _____________________________________________________________  Should you have questions after your visit to [facility], please contact our office at [phone] between the hours of 8:30 a.m. and 4:30 p.m.  Voicemails left after 4:30 p.m. will not be returned until the following business day.  For prescription refill requests, have your pharmacy contact our office.

## 2015-11-25 ENCOUNTER — Telehealth: Payer: Self-pay

## 2015-11-25 NOTE — Telephone Encounter (Signed)
Called patient to offer appt w/ NP-Megan Millikan. No answer.

## 2015-11-28 IMAGING — CT CT ABD-PEL WO/W CM
3 of 10 series · 12 of 46 positions shown, 18 images · IV contrast (Omnipaque 300)
Comparison: Pelvic ultrasound of 09/21/2006. No prior abdominal
imaging.

CLINICAL DATA: Gross hematuria.  On blood thinners.

EXAM:
CT ABDOMEN AND PELVIS WITHOUT AND WITH CONTRAST
TECHNIQUE: Multidetector CT imaging of the abdomen and pelvis was performed
following the standard protocol before and following the bolus
administration of intravenous contrast.
CONTRAST:  125mL OMNIPAQUE IOHEXOL 300 MG/ML  SOLN

[Series 2: hematuria pre 5.0 b40f · axial · non-contrast · 0.83mm/px · z∈[+783,+1118]mm · 6 of 95 slices shown, 11 images]
[im 14/95  soft-tissue]
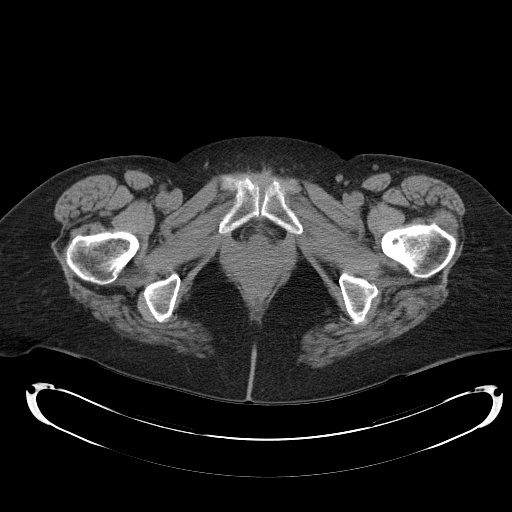
[im 14/95  bone]
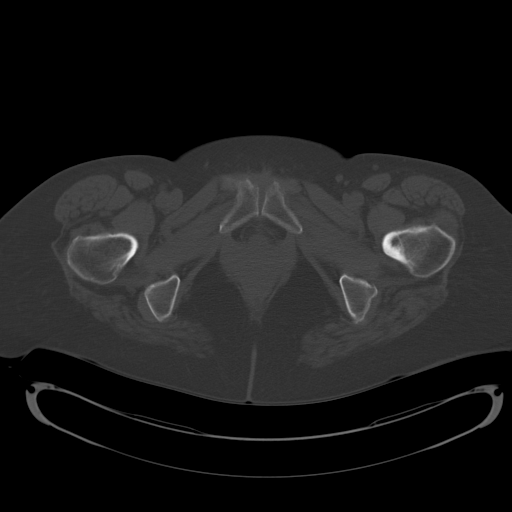
[im 27/95  soft-tissue]
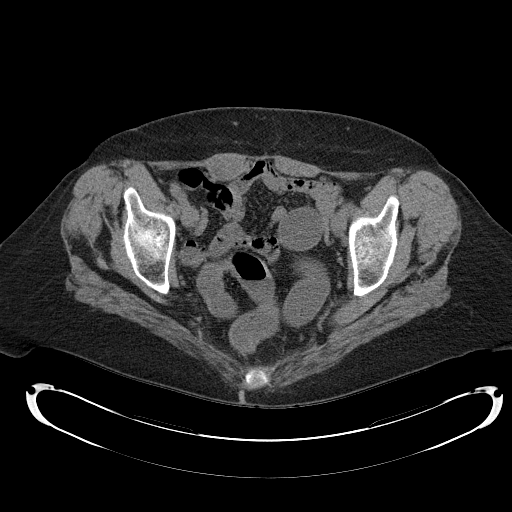
[im 41/95  soft-tissue]
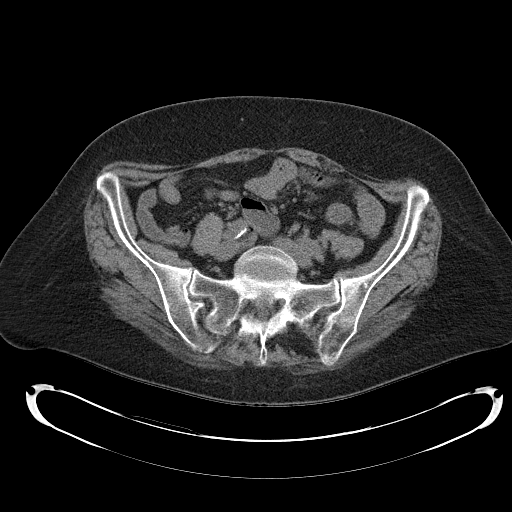
[im 41/95  lung]
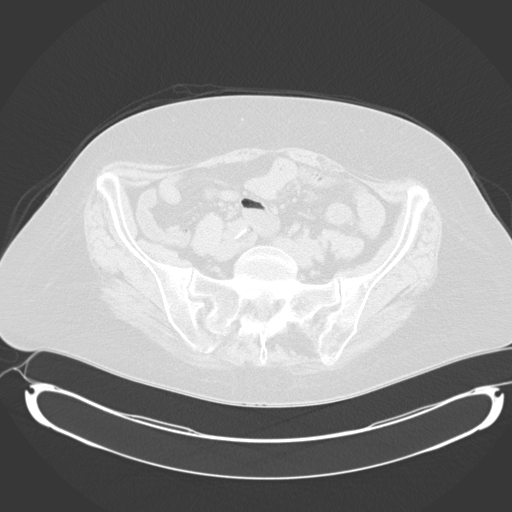
[im 54/95  soft-tissue]
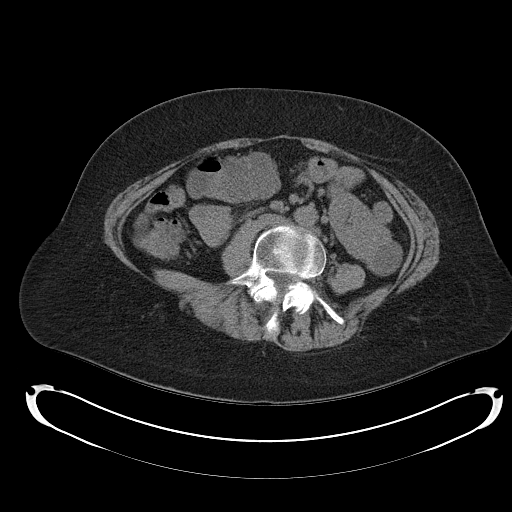
[im 54/95  lung]
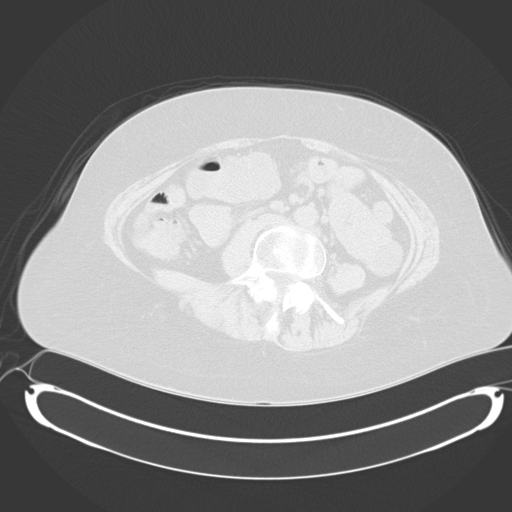
[im 68/95  soft-tissue]
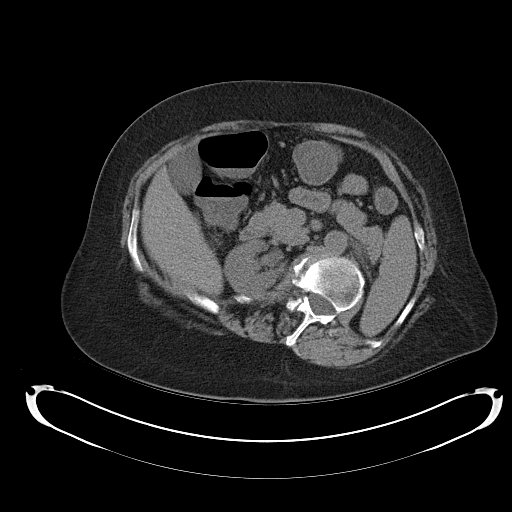
[im 68/95  lung]
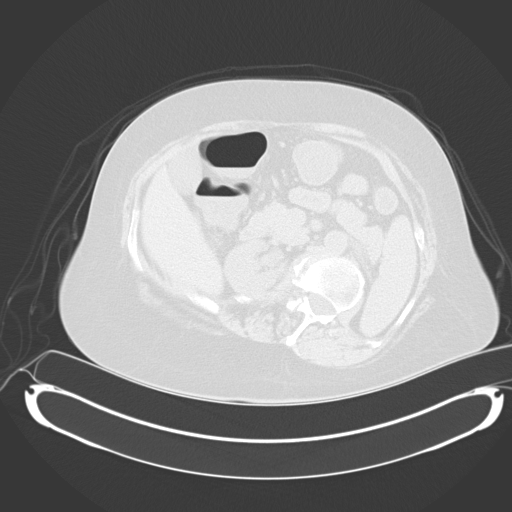
[im 81/95  soft-tissue]
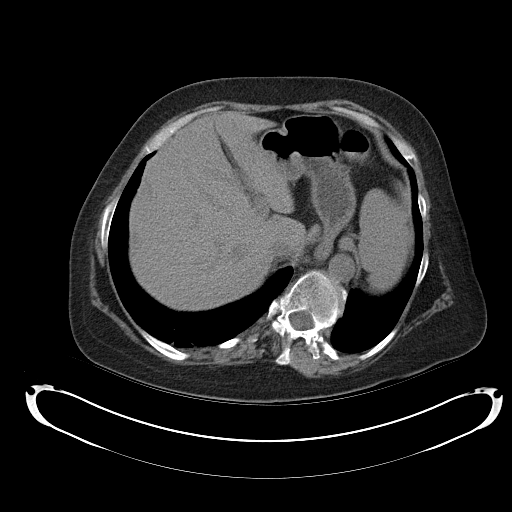
[im 81/95  lung]
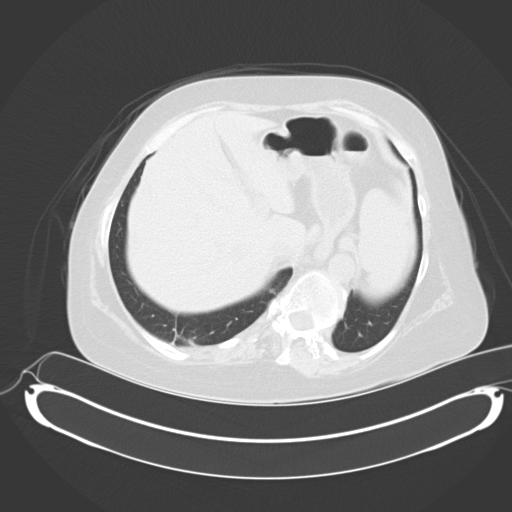

[Series 3: hematuria post axial 5.0 b40f · axial · 0.83mm/px · z∈[+783,+983]mm · 4 of 95 slices shown]
[im 14/95  soft-tissue]
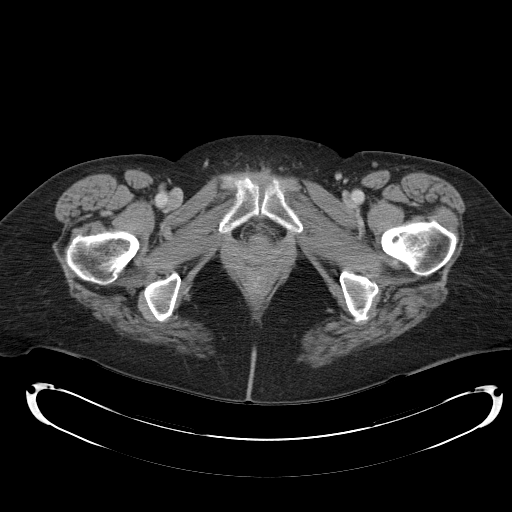
[im 27/95  soft-tissue]
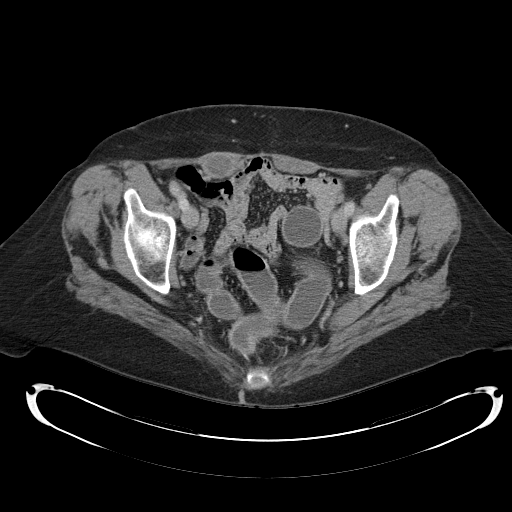
[im 41/95  soft-tissue]
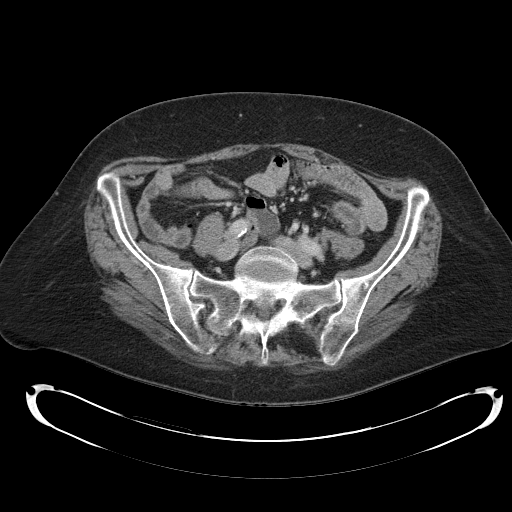
[im 54/95  soft-tissue]
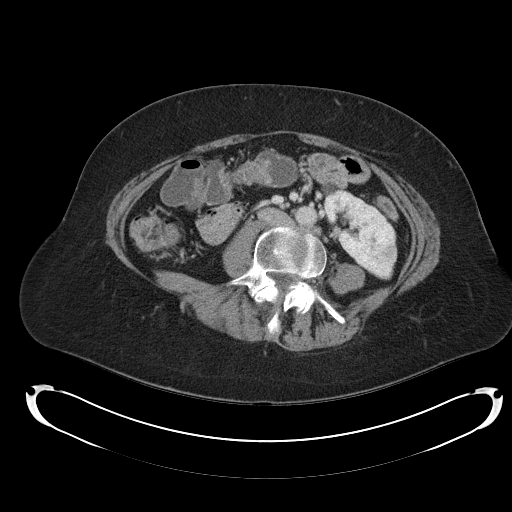

[Series 4: hematuria mpr pre coronal 3.0 · coronal · non-contrast · 0.83mm/px · 2 of 100 slices shown, 3 images]
[im 34/100  soft-tissue]
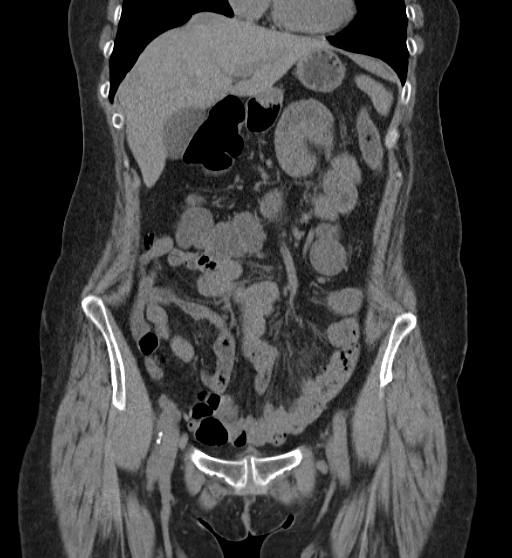
[im 34/100  bone]
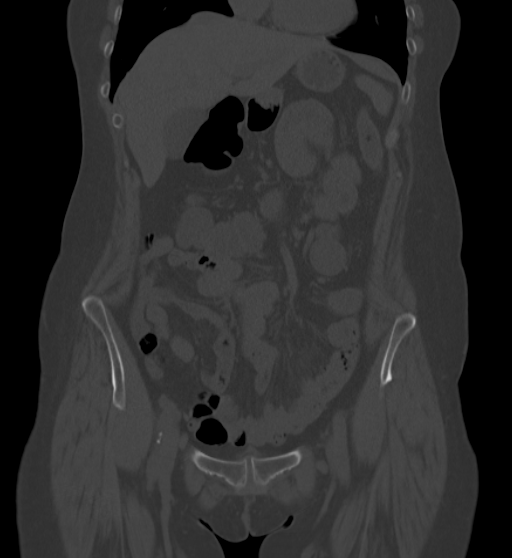
[im 67/100  soft-tissue]
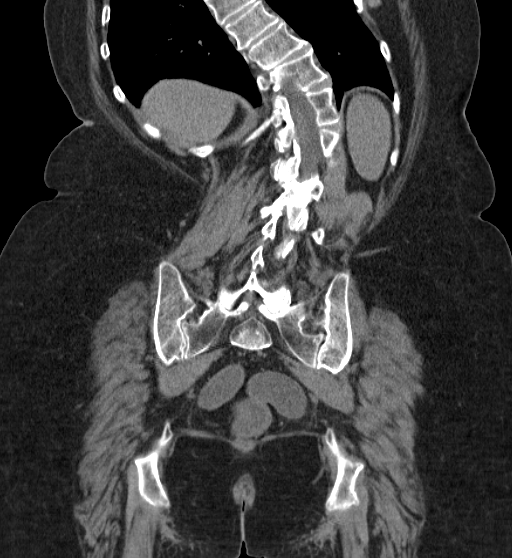

[12 of 46 positions shown; findings below may reference images not displayed]

FINDINGS: Lung bases: Calcified granuloma at the left lung base anteriorly.

Normal heart size without pericardial or pleural effusion. A
moderate hiatal hernia.

Kidneys / Ureters / Bladder: No renal calculi or hydronephrosis. No
hydroureter or ureteric calculi.

Cortical thinning and hypo enhancement involving the upper pole
right kidney on image 22/ series 3. Mild. No suspicious renal
lesion. Left renal cysts. Scattered foci of hyper attenuation within
the left kidney measure on the order of 3 mm or less. Example
coronal image 38/series 4. Likely tiny hemorrhagic cysts.

Moderate renal collecting system opacification on delayed images. A
right extrarenal pelvis. Portions of the distal most left ureter and
pelvic right ureter are incompletely opacified on delayed images. No
filling defects in the opacified portions.

Moderate bladder filling with contrast.  No filling defect.

Other: Normal liver, spleen, distal stomach, pancreas, gallbladder,
biliary tract, adrenal glands. Retroaortic left renal vein No
retroperitoneal or retrocrural adenopathy. Fluid-filled colon may
represent a diarrheal state. Normal terminal ileum and appendix.
Normal small bowel without abdominal ascites.

9 mm right external iliac node is likely reactive and not pathologic
by size criteria. Hysterectomy. No adnexal mass or significant free
fluid.

Fluid density "Collection" in the inferior right rectus muscle
measures slightly greater than fluid density and 2.7 cm on image 71.

Bones / Musculoskeletal: Moderate to marked convex left
thoracolumbar spine curvature.
IMPRESSION: 1.  No acute process or explanation for hematuria.
2. Cortical thinning and hypoenhancement involving the upper pole
right kidney. Favored to related to scarring and prior ischemia.
Focal pyelonephritis felt less likely.
3. Fluid throughout the colon, suggesting a diarrheal state.
4. Small rectus sheath fluid collection is likely a hematoma, given
the clinical history. These results will be called to the ordering
clinician or representative by the Radiologist Assistant, and
communication documented in the PACS or zVision Dashboard.
5. Moderate hiatal hernia.
6. Moderate to marked convex left spinal curvature.

## 2015-12-24 ENCOUNTER — Ambulatory Visit: Payer: PRIVATE HEALTH INSURANCE | Admitting: Neurology

## 2015-12-27 ENCOUNTER — Ambulatory Visit (INDEPENDENT_AMBULATORY_CARE_PROVIDER_SITE_OTHER): Payer: PRIVATE HEALTH INSURANCE | Admitting: Neurology

## 2015-12-27 ENCOUNTER — Ambulatory Visit: Payer: Self-pay | Admitting: Neurology

## 2015-12-27 ENCOUNTER — Encounter: Payer: Self-pay | Admitting: Neurology

## 2015-12-27 VITALS — BP 113/74 | HR 80 | Resp 20 | Ht 66.0 in | Wt 190.0 lb

## 2015-12-27 DIAGNOSIS — R569 Unspecified convulsions: Secondary | ICD-10-CM | POA: Diagnosis not present

## 2015-12-27 DIAGNOSIS — R413 Other amnesia: Secondary | ICD-10-CM

## 2015-12-27 MED ORDER — TOPIRAMATE 50 MG PO TABS: 50.0000 mg | ORAL_TABLET | Freq: Two times a day (BID) | ORAL | Status: DC
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
## 2016-01-20 ENCOUNTER — Other Ambulatory Visit (HOSPITAL_COMMUNITY): Payer: Self-pay | Admitting: Oncology

## 2016-01-27 ENCOUNTER — Other Ambulatory Visit (HOSPITAL_COMMUNITY): Payer: PRIVATE HEALTH INSURANCE

## 2016-01-27 ENCOUNTER — Other Ambulatory Visit: Payer: Self-pay | Admitting: Adult Health

## 2016-01-27 DIAGNOSIS — Z1231 Encounter for screening mammogram for malignant neoplasm of breast: Secondary | ICD-10-CM

## 2016-02-15 ENCOUNTER — Other Ambulatory Visit: Payer: Self-pay | Admitting: Neurology

## 2016-02-15 LAB — COMPREHENSIVE METABOLIC PANEL
ALBUMIN: 4.1 g/dL (ref 3.6–5.1)
ALK PHOS: 102 U/L (ref 33–130)
ALT: 23 U/L (ref 6–29)
AST: 23 U/L (ref 10–35)
BILIRUBIN TOTAL: 0.8 mg/dL (ref 0.2–1.2)
BUN: 18 mg/dL (ref 7–25)
CO2: 27 mmol/L (ref 20–31)
Calcium: 8.8 mg/dL (ref 8.6–10.4)
Chloride: 100 mmol/L (ref 98–110)
Creat: 1.03 mg/dL (ref 0.50–1.05)
GLUCOSE: 83 mg/dL (ref 65–99)
Potassium: 3.2 mmol/L — ABNORMAL LOW (ref 3.5–5.3)
Sodium: 138 mmol/L (ref 135–146)
Total Protein: 6.5 g/dL (ref 6.1–8.1)

## 2016-02-15 LAB — CBC
HCT: 41.1 % (ref 36.0–46.0)
HEMOGLOBIN: 13.4 g/dL (ref 12.0–15.0)
MCH: 28.3 pg (ref 26.0–34.0)
MCHC: 32.6 g/dL (ref 30.0–36.0)
MCV: 86.9 fL (ref 78.0–100.0)
MPV: 12.2 fL (ref 8.6–12.4)
Platelets: 293 10*3/uL (ref 150–400)
RBC: 4.73 MIL/uL (ref 3.87–5.11)
RDW: 14.8 % (ref 11.5–15.5)
WBC: 5.9 10*3/uL (ref 4.0–10.5)

## 2016-02-15 LAB — C-REACTIVE PROTEIN

## 2016-02-15 LAB — TSH: TSH: 1.71 mIU/L

## 2016-02-16 LAB — SEDIMENTATION RATE: Sed Rate: 22 mm/hr (ref 0–30)

## 2016-02-17 ENCOUNTER — Telehealth (HOSPITAL_COMMUNITY): Payer: Self-pay | Admitting: *Deleted

## 2016-02-17 ENCOUNTER — Other Ambulatory Visit (HOSPITAL_COMMUNITY): Payer: Self-pay | Admitting: Oncology

## 2016-02-17 LAB — POCT INR: INR: 1.81

## 2016-02-17 NOTE — Telephone Encounter (Signed)
Spoke with pt - she reports that she is currently on Coumadin 3.5, 3.5, 4mg  rotating.  States she has not missed any doses, and has just recently started a "heavy duty" vitamin regimen.  Kirby Crigler, PA notified - instructed pt to increase coumadin to 4mg  daily x 3 days, then to take 3.5 mg, 4mg , 4mg  rotating and INR recheck in 2 weeks.  Pt verbalizes understanding.

## 2016-02-27 LAB — VITAMIN B2(RIBOFLAVIN),PLASMA: Vitamin B2(Riboflavin),Plasma: 12.6 nmol/L (ref 6.2–39.0)

## 2016-03-02 ENCOUNTER — Other Ambulatory Visit (HOSPITAL_COMMUNITY): Payer: PRIVATE HEALTH INSURANCE

## 2016-03-02 ENCOUNTER — Ambulatory Visit (HOSPITAL_COMMUNITY): Payer: PRIVATE HEALTH INSURANCE

## 2016-03-04 ENCOUNTER — Ambulatory Visit (HOSPITAL_COMMUNITY)
Admission: RE | Admit: 2016-03-04 | Discharge: 2016-03-04 | Disposition: A | Discharge status: Home / Self Care | Payer: PRIVATE HEALTH INSURANCE | Source: Ambulatory Visit | Attending: Adult Health | Admitting: Adult Health

## 2016-03-04 ENCOUNTER — Ambulatory Visit (HOSPITAL_COMMUNITY)
Admission: RE | Admit: 2016-03-04 | Discharge: 2016-03-04 | Disposition: A | Discharge status: Home / Self Care | Payer: PRIVATE HEALTH INSURANCE | Source: Ambulatory Visit | Attending: Oncology | Admitting: Oncology

## 2016-03-04 ENCOUNTER — Other Ambulatory Visit: Payer: Self-pay | Admitting: Adult Health

## 2016-03-04 DIAGNOSIS — T457X5S Adverse effect of anticoagulant antagonists, vitamin K and other coagulants, sequela: Secondary | ICD-10-CM | POA: Diagnosis not present

## 2016-03-04 DIAGNOSIS — S2232XA Fracture of one rib, left side, initial encounter for closed fracture: Secondary | ICD-10-CM | POA: Insufficient documentation

## 2016-03-04 DIAGNOSIS — Z780 Asymptomatic menopausal state: Secondary | ICD-10-CM | POA: Insufficient documentation

## 2016-03-04 DIAGNOSIS — Z1231 Encounter for screening mammogram for malignant neoplasm of breast: Secondary | ICD-10-CM | POA: Insufficient documentation

## 2016-03-04 DIAGNOSIS — X58XXXA Exposure to other specified factors, initial encounter: Secondary | ICD-10-CM | POA: Insufficient documentation

## 2016-03-04 DIAGNOSIS — M810 Age-related osteoporosis without current pathological fracture: Secondary | ICD-10-CM | POA: Diagnosis not present

## 2016-04-15 ENCOUNTER — Encounter (HOSPITAL_COMMUNITY): Payer: Self-pay | Admitting: Oncology

## 2016-04-15 ENCOUNTER — Other Ambulatory Visit (HOSPITAL_COMMUNITY): Payer: Self-pay | Admitting: Oncology

## 2016-04-15 DIAGNOSIS — M810 Age-related osteoporosis without current pathological fracture: Secondary | ICD-10-CM

## 2016-04-15 HISTORY — DX: Age-related osteoporosis without current pathological fracture: M81.0

## 2016-04-20 ENCOUNTER — Other Ambulatory Visit (HOSPITAL_COMMUNITY): Payer: Self-pay | Admitting: Oncology

## 2016-04-25 DIAGNOSIS — D6852 Prothrombin gene mutation: Secondary | ICD-10-CM | POA: Diagnosis not present

## 2016-04-25 DIAGNOSIS — D6861 Antiphospholipid syndrome: Secondary | ICD-10-CM | POA: Diagnosis not present

## 2016-04-27 ENCOUNTER — Encounter (HOSPITAL_COMMUNITY): Payer: Self-pay | Admitting: *Deleted

## 2016-04-27 ENCOUNTER — Other Ambulatory Visit (HOSPITAL_COMMUNITY): Payer: Self-pay | Admitting: Emergency Medicine

## 2016-04-27 ENCOUNTER — Other Ambulatory Visit (HOSPITAL_COMMUNITY): Payer: Self-pay | Admitting: Oncology

## 2016-04-27 LAB — POCT INR: INR: 2.98

## 2016-05-14 ENCOUNTER — Ambulatory Visit (HOSPITAL_COMMUNITY): Payer: PRIVATE HEALTH INSURANCE | Admitting: Oncology

## 2016-05-14 ENCOUNTER — Other Ambulatory Visit (HOSPITAL_COMMUNITY): Payer: PRIVATE HEALTH INSURANCE

## 2016-05-29 ENCOUNTER — Telehealth (HOSPITAL_COMMUNITY): Payer: Self-pay

## 2016-05-29 ENCOUNTER — Encounter (HOSPITAL_COMMUNITY): Payer: PRIVATE HEALTH INSURANCE | Attending: Oncology | Admitting: Oncology

## 2016-05-29 ENCOUNTER — Encounter (HOSPITAL_COMMUNITY): Payer: PRIVATE HEALTH INSURANCE

## 2016-05-29 VITALS — BP 127/74 | HR 82 | Temp 97.5°F | Resp 18 | Wt 191.5 lb

## 2016-05-29 DIAGNOSIS — Z7901 Long term (current) use of anticoagulants: Secondary | ICD-10-CM | POA: Diagnosis not present

## 2016-05-29 DIAGNOSIS — E611 Iron deficiency: Secondary | ICD-10-CM

## 2016-05-29 DIAGNOSIS — D6861 Antiphospholipid syndrome: Secondary | ICD-10-CM | POA: Diagnosis not present

## 2016-05-29 DIAGNOSIS — Z9889 Other specified postprocedural states: Secondary | ICD-10-CM | POA: Diagnosis not present

## 2016-05-29 DIAGNOSIS — D509 Iron deficiency anemia, unspecified: Secondary | ICD-10-CM | POA: Diagnosis not present

## 2016-05-29 DIAGNOSIS — F17210 Nicotine dependence, cigarettes, uncomplicated: Secondary | ICD-10-CM | POA: Diagnosis not present

## 2016-05-29 DIAGNOSIS — M419 Scoliosis, unspecified: Secondary | ICD-10-CM | POA: Diagnosis not present

## 2016-05-29 DIAGNOSIS — D6859 Other primary thrombophilia: Secondary | ICD-10-CM

## 2016-05-29 DIAGNOSIS — R112 Nausea with vomiting, unspecified: Secondary | ICD-10-CM | POA: Insufficient documentation

## 2016-05-29 DIAGNOSIS — G2581 Restless legs syndrome: Secondary | ICD-10-CM | POA: Insufficient documentation

## 2016-05-29 DIAGNOSIS — M810 Age-related osteoporosis without current pathological fracture: Secondary | ICD-10-CM | POA: Insufficient documentation

## 2016-05-29 DIAGNOSIS — Z780 Asymptomatic menopausal state: Secondary | ICD-10-CM | POA: Insufficient documentation

## 2016-05-29 LAB — CBC WITH DIFFERENTIAL/PLATELET
BASOS ABS: 0.1 10*3/uL (ref 0.0–0.1)
Basophils Relative: 1 %
EOS ABS: 0.1 10*3/uL (ref 0.0–0.7)
EOS PCT: 2 %
HCT: 40.3 % (ref 36.0–46.0)
Hemoglobin: 13.3 g/dL (ref 12.0–15.0)
LYMPHS PCT: 19 %
Lymphs Abs: 1.2 10*3/uL (ref 0.7–4.0)
MCH: 29 pg (ref 26.0–34.0)
MCHC: 33 g/dL (ref 30.0–36.0)
MCV: 88 fL (ref 78.0–100.0)
MONO ABS: 0.7 10*3/uL (ref 0.1–1.0)
Monocytes Relative: 11 %
Neutro Abs: 4.3 10*3/uL (ref 1.7–7.7)
Neutrophils Relative %: 67 %
PLATELETS: 210 10*3/uL (ref 150–400)
RBC: 4.58 MIL/uL (ref 3.87–5.11)
RDW: 14.5 % (ref 11.5–15.5)
WBC: 6.4 10*3/uL (ref 4.0–10.5)

## 2016-05-29 LAB — IRON AND TIBC
IRON: 54 ug/dL (ref 28–170)
SATURATION RATIOS: 12 % (ref 10.4–31.8)
TIBC: 468 ug/dL — AB (ref 250–450)
UIBC: 414 ug/dL

## 2016-05-29 LAB — FERRITIN: FERRITIN: 12 ng/mL (ref 11–307)

## 2016-05-29 LAB — PROTIME-INR
INR: 2.53 — ABNORMAL HIGH (ref 0.00–1.49)
PROTHROMBIN TIME: 26.9 s — AB (ref 11.6–15.2)

## 2016-05-29 MED ORDER — ENOXAPARIN SODIUM 150 MG/ML ~~LOC~~ SOLN: 150.0000 mg | SUBCUTANEOUS | Status: DC

## 2016-05-29 NOTE — Assessment & Plan Note (Signed)
Iron deficiency without anemia, requiring IV iron replacement. Now on by mouth iron replacement.  Oncology Flowsheet 05/31/2015 11/22/2015  ferric gluconate (NULECIT) IV 125 mg 125 mg    Labs today: CBC with differential, iron/TIBC, ferritin.  I personally reviewed and went over laboratory results with the patient.  The results are noted within this dictation.  CBC with differential is within normal limits. Iron studies are pending at this time.  Labs in 6 months: CBC with differential, iron/TIBC, ferritin.

## 2016-05-29 NOTE — Assessment & Plan Note (Signed)
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  Bone density on 03/04/2016 demonstrated: BMD as determined from Forearm Radius 33% is 0.471 g/cm2 with a T-Score of -3.4. This patient is considered osteoporotic according to [facility]) criteria.   She was tried on Fosamax 70 mg weekly which she did not tolerate; complicated with nausea, vomiting, and abdominal pain. I discussed this with her primary care physician, Dr. [place] Crane. We've concluded that we will resubmit Prolia to her insurance company now that she has displayed intolerance to oral bisphosphonate therapy.  Supportive therapy plan builds for Prolia. Message sent to Lendell Caprice for approval with her insurance. Once approved, we will start Prolia injections every 6 months.
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
## 2016-05-29 NOTE — Assessment & Plan Note (Addendum)
Thrombophilia manifesting as antiphospholipid antibody, tolerating warfarin well without any VTE while on vitamin K antagonist therapy.  Labs today: CBC diff, PT/INR.  I personally reviewed and went over laboratory results with the patient.  The results are noted within this dictation.  INR is 2.35. She is advised to continue her same dose of Coumadin.  Continue Coumadin with INR monitoring as directed.   She is going to have a tooth extraction on 06/08/2016. We will bridge her with Lovenox accordingly. 5 days prior to the procedure, she will hold her Coumadin and start Lovenox at 1.5 mg/kg daily. She will continue Lovenox daily and hold Lovenox 24 hours prior to her procedure. 24 hours post procedure, she will restart her Lovenox at 1.5 mg/kg daily and Coumadin at her current dose (3.5, 3.5, 4 mg repeating). She will continue with Lovenox therapy until directed. She will have a repeat INR 7 days after dental procedure.  I personally reviewed and went over radiographic studies with the patient.  The results are noted within this dictation.  Mammogram on 03/06/2016 as BI-RADS Category 1.  Return in 6 months for follow-up.

## 2016-05-29 NOTE — Patient Instructions (Addendum)
[place] at [facility] Discharge Instructions  RECOMMENDATIONS MADE BY THE CONSULTANT AND ANY TEST RESULTS WILL BE SENT TO YOUR REFERRING PHYSICIAN.  Exam done and seen today by Kirby Crigler You have a tooth extraction scheduled 06/08/2016.  As a result, we will bridge your Coumadin to Lovenox in preparation for tooth extraction and then back to Coumadin afterwards.  We will escribe your Lovenox to [facility] for you.   Return to see the Doctor in 6 months. Call the clinic for any concerns or questions.  Thank you for choosing [place] at [facility] to provide your oncology and hematology care.  To afford each patient quality time with our provider, please arrive at least 15 minutes before your scheduled appointment time.   Beginning January 23rd 2017 lab work for the [facility] will be done in the  Main lab at [facility] on 1st floor. If you have a lab appointment with the [place] please come in thru the  Main Entrance and check in at the main information desk  You need to re-schedule your appointment should you arrive 10 or more minutes late.  We strive to give you quality time with our providers, and arriving late affects you and other patients whose appointments are after yours.  Also, if you no show three or more times for appointments you may be dismissed from the clinic at the providers discretion.     Again, thank you for choosing [facility].  Our hope is that these requests will decrease the amount of time that you wait before being seen by our physicians.       _____________________________________________________________  Should you have questions after your visit to [facility], please contact our office at [phone] between the hours of 8:30 a.m. and 4:30 p.m.  Voicemails left after 4:30 p.m. will not be returned until the following business day.  For prescription refill requests, have your  pharmacy contact our office.         Resources For Cancer Patients and their Caregivers ? American Cancer Society: Can assist with transportation, wigs, general needs, runs Look Good Feel Better.        (671)297-1059 ? Cancer Care: Provides financial assistance, online support groups, medication/co-pay assistance.  1-800-813-HOPE (620)689-4545) ? [place] Assists [place] cancer patients and their families through emotional , educational and financial support.  256-653-4848 ? [place] DSS Where to apply for food stamps, Medicaid and utility assistance. 619 793 4425 ? RCATS: Transportation to medical appointments. (867)047-5395 ? Social Security Administration: May apply for disability if have a Stage IV cancer. (346)058-1309 (562) 627-5339 ? [employer], Disability and Transit Services: Assists with nutrition, care and transit needs. [place] Support Programs: @10RELATIVEDAYS @ > Cancer Support Group  2nd Tuesday of the month 1pm-2pm, Journey Room  > Creative Journey  3rd Tuesday of the month 1130am-1pm, Journey Room  > Look Good Feel Better  1st Wednesday of the month 10am-12 noon, Journey Room (Call [place] to register [phone])

## 2016-05-29 NOTE — Telephone Encounter (Signed)
Called patient to give her specific instructions on her coumadin and lovenox prior to surgery and post op. INR recheck 7 days after restarting.

## 2016-06-01 ENCOUNTER — Other Ambulatory Visit (HOSPITAL_COMMUNITY): Payer: Self-pay | Admitting: Oncology

## 2016-06-09 ENCOUNTER — Other Ambulatory Visit (HOSPITAL_COMMUNITY): Payer: Self-pay | Admitting: Oncology

## 2016-06-09 DIAGNOSIS — M810 Age-related osteoporosis without current pathological fracture: Secondary | ICD-10-CM

## 2016-06-15 ENCOUNTER — Encounter (HOSPITAL_COMMUNITY): Payer: PRIVATE HEALTH INSURANCE | Attending: Hematology & Oncology

## 2016-06-15 DIAGNOSIS — F17210 Nicotine dependence, cigarettes, uncomplicated: Secondary | ICD-10-CM | POA: Insufficient documentation

## 2016-06-15 DIAGNOSIS — Z7901 Long term (current) use of anticoagulants: Secondary | ICD-10-CM | POA: Insufficient documentation

## 2016-06-15 DIAGNOSIS — D509 Iron deficiency anemia, unspecified: Secondary | ICD-10-CM | POA: Diagnosis not present

## 2016-06-15 DIAGNOSIS — E611 Iron deficiency: Secondary | ICD-10-CM | POA: Diagnosis present

## 2016-06-15 DIAGNOSIS — D6859 Other primary thrombophilia: Secondary | ICD-10-CM | POA: Diagnosis present

## 2016-06-15 DIAGNOSIS — G2581 Restless legs syndrome: Secondary | ICD-10-CM | POA: Diagnosis not present

## 2016-06-15 DIAGNOSIS — M419 Scoliosis, unspecified: Secondary | ICD-10-CM | POA: Insufficient documentation

## 2016-06-15 DIAGNOSIS — Z780 Asymptomatic menopausal state: Secondary | ICD-10-CM | POA: Diagnosis not present

## 2016-06-15 DIAGNOSIS — M810 Age-related osteoporosis without current pathological fracture: Secondary | ICD-10-CM | POA: Insufficient documentation

## 2016-06-15 DIAGNOSIS — Z9889 Other specified postprocedural states: Secondary | ICD-10-CM | POA: Insufficient documentation

## 2016-06-15 DIAGNOSIS — D6861 Antiphospholipid syndrome: Secondary | ICD-10-CM | POA: Insufficient documentation

## 2016-06-15 DIAGNOSIS — R112 Nausea with vomiting, unspecified: Secondary | ICD-10-CM | POA: Diagnosis not present

## 2016-06-15 LAB — PROTIME-INR
INR: 0.88 (ref 0.00–1.49)
Prothrombin Time: 12.2 seconds (ref 11.6–15.2)

## 2016-06-15 NOTE — Progress Notes (Unsigned)
Message left on pt's vm regarding coumadin/lovenox/inr recheck.

## 2016-06-17 ENCOUNTER — Other Ambulatory Visit (HOSPITAL_COMMUNITY): Payer: Self-pay | Admitting: Oncology

## 2016-06-17 MED ORDER — WARFARIN SODIUM 3 MG PO TABS
ORAL_TABLET | ORAL | Status: DC
Start: 2016-06-17 — End: 2016-11-27

## 2016-06-17 MED ORDER — WARFARIN SODIUM 1 MG PO TABS: 1.0000 mg | ORAL_TABLET | Freq: Every day | ORAL | Status: DC

## 2016-06-18 ENCOUNTER — Ambulatory Visit (HOSPITAL_COMMUNITY): Payer: PRIVATE HEALTH INSURANCE

## 2016-06-18 ENCOUNTER — Encounter (HOSPITAL_COMMUNITY): Payer: PRIVATE HEALTH INSURANCE

## 2016-06-18 ENCOUNTER — Encounter (HOSPITAL_BASED_OUTPATIENT_CLINIC_OR_DEPARTMENT_OTHER): Payer: PRIVATE HEALTH INSURANCE

## 2016-06-18 VITALS — BP 99/61 | HR 63 | Temp 97.5°F | Resp 18

## 2016-06-18 DIAGNOSIS — Z780 Asymptomatic menopausal state: Secondary | ICD-10-CM | POA: Diagnosis not present

## 2016-06-18 DIAGNOSIS — D6861 Antiphospholipid syndrome: Secondary | ICD-10-CM | POA: Diagnosis not present

## 2016-06-18 DIAGNOSIS — Z7901 Long term (current) use of anticoagulants: Secondary | ICD-10-CM | POA: Diagnosis not present

## 2016-06-18 DIAGNOSIS — D509 Iron deficiency anemia, unspecified: Secondary | ICD-10-CM

## 2016-06-18 DIAGNOSIS — M810 Age-related osteoporosis without current pathological fracture: Secondary | ICD-10-CM

## 2016-06-18 DIAGNOSIS — G2581 Restless legs syndrome: Secondary | ICD-10-CM | POA: Diagnosis not present

## 2016-06-18 DIAGNOSIS — M419 Scoliosis, unspecified: Secondary | ICD-10-CM | POA: Diagnosis not present

## 2016-06-18 DIAGNOSIS — F17210 Nicotine dependence, cigarettes, uncomplicated: Secondary | ICD-10-CM | POA: Diagnosis not present

## 2016-06-18 DIAGNOSIS — R112 Nausea with vomiting, unspecified: Secondary | ICD-10-CM | POA: Diagnosis not present

## 2016-06-18 DIAGNOSIS — Z9889 Other specified postprocedural states: Secondary | ICD-10-CM | POA: Diagnosis not present

## 2016-06-18 LAB — COMPREHENSIVE METABOLIC PANEL
ALBUMIN: 4.1 g/dL (ref 3.5–5.0)
ALK PHOS: 116 U/L (ref 38–126)
ALT: 46 U/L (ref 14–54)
ANION GAP: 11 (ref 5–15)
AST: 31 U/L (ref 15–41)
BILIRUBIN TOTAL: 0.9 mg/dL (ref 0.3–1.2)
BUN: 21 mg/dL — AB (ref 6–20)
CALCIUM: 9.1 mg/dL (ref 8.9–10.3)
CO2: 28 mmol/L (ref 22–32)
Chloride: 93 mmol/L — ABNORMAL LOW (ref 101–111)
Creatinine, Ser: 1.38 mg/dL — ABNORMAL HIGH (ref 0.44–1.00)
GFR calc Af Amer: 49 mL/min — ABNORMAL LOW (ref >60)
GFR calc non Af Amer: 42 mL/min — ABNORMAL LOW (ref >60)
GLUCOSE: 85 mg/dL (ref 65–99)
Potassium: 3 mmol/L — ABNORMAL LOW (ref 3.5–5.1)
SODIUM: 132 mmol/L — AB (ref 135–145)
TOTAL PROTEIN: 7.3 g/dL (ref 6.5–8.1)

## 2016-06-18 MED ORDER — SODIUM CHLORIDE 0.9 % IV SOLN
250.0000 mg | Freq: Once | INTRAVENOUS | Status: AC
  Administered 2016-06-18: 250 mg via INTRAVENOUS
  Filled 2016-06-18: qty 20

## 2016-06-18 MED ORDER — DENOSUMAB 60 MG/ML ~~LOC~~ SOLN
60.0000 mg | Freq: Once | SUBCUTANEOUS | Status: AC
  Administered 2016-06-18: 60 mg via SUBCUTANEOUS
  Filled 2016-06-18: qty 1

## 2016-06-18 MED ORDER — SODIUM CHLORIDE 0.9 % IV SOLN
INTRAVENOUS | Status: DC
  Administered 2016-06-18: 14:00:00 via INTRAVENOUS

## 2016-06-18 NOTE — Progress Notes (Signed)
Samantha Fields presents today for injection per MD orders. Prolia 60mg  administered SQ in left Abdomen. Administration without incident. Patient tolerated well.      IV ferric gluconate given today per orders. Patient tolerated without any problems. Will call for any concerns or questions

## 2016-06-18 NOTE — Patient Instructions (Signed)
[place] at [facility] Discharge Instructions  RECOMMENDATIONS MADE BY THE CONSULTANT AND ANY TEST RESULTS WILL BE SENT TO YOUR REFERRING PHYSICIAN.  IV iron given today. Prolia given as well Prolia info given for patient to read. Follow up as scheduled. Call for any concerns or questions  Thank you for choosing [place] at [facility] to provide your oncology and hematology care.  To afford each patient quality time with our provider, please arrive at least 15 minutes before your scheduled appointment time.   Beginning January 23rd 2017 lab work for the [facility] will be done in the  Main lab at [facility] on 1st floor. If you have a lab appointment with the [place] please come in thru the  Main Entrance and check in at the main information desk  You need to re-schedule your appointment should you arrive 10 or more minutes late.  We strive to give you quality time with our providers, and arriving late affects you and other patients whose appointments are after yours.  Also, if you no show three or more times for appointments you may be dismissed from the clinic at the providers discretion.     Again, thank you for choosing [facility].  Our hope is that these requests will decrease the amount of time that you wait before being seen by our physicians.       _____________________________________________________________  Should you have questions after your visit to [facility], please contact our office at [phone] between the hours of 8:30 a.m. and 4:30 p.m.  Voicemails left after 4:30 p.m. will not be returned until the following business day.  For prescription refill requests, have your pharmacy contact our office.         Resources For Cancer Patients and their Caregivers ? American Cancer Society: Can assist with transportation, wigs, general needs, runs Look Good Feel Better.         904-786-5062 ? Cancer Care: Provides financial assistance, online support groups, medication/co-pay assistance.  1-800-813-HOPE (920)154-3305) ? [place] Assists [place] cancer patients and their families through emotional , educational and financial support.  (575)392-6517 ? [place] DSS Where to apply for food stamps, Medicaid and utility assistance. 478-365-6844 ? RCATS: Transportation to medical appointments. 660-107-5253 ? Social Security Administration: May apply for disability if have a Stage IV cancer. 213-448-2535 516-296-8966 ? [employer], Disability and Transit Services: Assists with nutrition, care and transit needs. [place] Support Programs: @10RELATIVEDAYS @ > Cancer Support Group  2nd Tuesday of the month 1pm-2pm, Journey Room  > Creative Journey  3rd Tuesday of the month 1130am-1pm, Journey Room  > Look Good Feel Better  1st Wednesday of the month 10am-12 noon, Journey Room (Call [place] to register [phone])

## 2016-06-19 DIAGNOSIS — D509 Iron deficiency anemia, unspecified: Secondary | ICD-10-CM | POA: Diagnosis not present

## 2016-06-19 DIAGNOSIS — E782 Mixed hyperlipidemia: Secondary | ICD-10-CM | POA: Diagnosis not present

## 2016-06-21 ENCOUNTER — Other Ambulatory Visit: Payer: Self-pay | Admitting: Adult Health

## 2016-06-23 ENCOUNTER — Other Ambulatory Visit (HOSPITAL_COMMUNITY): Payer: Self-pay | Admitting: Oncology

## 2016-06-23 DIAGNOSIS — D6852 Prothrombin gene mutation: Secondary | ICD-10-CM | POA: Diagnosis not present

## 2016-06-23 DIAGNOSIS — D6861 Antiphospholipid syndrome: Secondary | ICD-10-CM | POA: Diagnosis not present

## 2016-06-23 DIAGNOSIS — E876 Hypokalemia: Secondary | ICD-10-CM

## 2016-06-23 MED ORDER — POTASSIUM CHLORIDE CRYS ER 20 MEQ PO TBCR: 20.0000 meq | EXTENDED_RELEASE_TABLET | Freq: Two times a day (BID) | ORAL | Status: DC

## 2016-06-24 ENCOUNTER — Telehealth (HOSPITAL_COMMUNITY): Payer: Self-pay | Admitting: *Deleted

## 2016-06-24 ENCOUNTER — Other Ambulatory Visit (HOSPITAL_COMMUNITY): Payer: Self-pay | Admitting: *Deleted

## 2016-06-24 LAB — INR (THROMBOREL-S): INR: 2.1

## 2016-06-25 ENCOUNTER — Ambulatory Visit (INDEPENDENT_AMBULATORY_CARE_PROVIDER_SITE_OTHER): Payer: PRIVATE HEALTH INSURANCE | Admitting: Neurology

## 2016-06-25 ENCOUNTER — Encounter: Payer: Self-pay | Admitting: Neurology

## 2016-06-25 VITALS — Ht 66.0 in | Wt 186.8 lb

## 2016-06-25 DIAGNOSIS — R413 Other amnesia: Secondary | ICD-10-CM | POA: Diagnosis not present

## 2016-06-25 DIAGNOSIS — R569 Unspecified convulsions: Secondary | ICD-10-CM | POA: Diagnosis not present

## 2016-06-25 MED ORDER — TOPIRAMATE ER 100 MG PO CAP24: 200.0000 mg | ORAL_CAPSULE | Freq: Every day | ORAL | Status: DC
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
## 2016-06-25 NOTE — Addendum Note (Signed)
Addended by: Marcial Pacas on: 06/25/2016 08:09 AM   Modules accepted: Orders

## 2016-06-26 ENCOUNTER — Other Ambulatory Visit (HOSPITAL_COMMUNITY): Payer: Self-pay | Admitting: Oncology

## 2016-06-26 LAB — VITAMIN B12: VITAMIN B 12: 403 pg/mL (ref 211–946)

## 2016-06-26 LAB — FOLATE: Folate: 13.7 ng/mL (ref >3.0)

## 2016-06-29 ENCOUNTER — Other Ambulatory Visit: Payer: Self-pay | Admitting: *Deleted

## 2016-07-09 ENCOUNTER — Telehealth: Payer: Self-pay | Admitting: Neurology

## 2016-07-09 NOTE — Telephone Encounter (Signed)
Pt returned call about MRI.I relayed she will need to call [facility]. She expressed understanding.

## 2016-08-26 ENCOUNTER — Ambulatory Visit (HOSPITAL_COMMUNITY)
Admission: RE | Admit: 2016-08-26 | Discharge: 2016-08-26 | Disposition: A | Discharge status: Home / Self Care | Payer: PRIVATE HEALTH INSURANCE | Source: Ambulatory Visit | Attending: Neurology | Admitting: Neurology

## 2016-08-26 ENCOUNTER — Telehealth: Payer: Self-pay | Admitting: Neurology

## 2016-08-26 DIAGNOSIS — R413 Other amnesia: Secondary | ICD-10-CM | POA: Insufficient documentation

## 2016-08-26 DIAGNOSIS — R569 Unspecified convulsions: Secondary | ICD-10-CM | POA: Insufficient documentation

## 2016-08-26 NOTE — Telephone Encounter (Signed)
[facility] [phone] called requesting to speak with Andee Poles, states she just spoke with Andee Poles regarding authorization for [facility], states she also needs one for [facility], advised Hector Brunswick is currently in a meeting, message has been relayed to [place]. Tillie Rung states patient is coming in today. Please call.

## 2016-08-27 NOTE — Telephone Encounter (Signed)
Spoke to patient concerning her results - she will keep her follow up on 08/31/16 to further discuss findings.

## 2016-08-27 NOTE — Telephone Encounter (Signed)
Please call patient MRI of the brain showed mild supratentorium small vessel disease, I will review MRI findings with her in detail at follow-up visit   No acute finding. Chronic abnormal white matter signal affecting both cerebral hemispheres, with scattered punctate foci of hemosiderin deposition. Findings are nonspecific and could be due to any sort of old white matter insult including ischemia, trauma, inflammatory disease or demyelinating disease. The findings are not progressive over time however.

## 2016-08-31 ENCOUNTER — Ambulatory Visit (INDEPENDENT_AMBULATORY_CARE_PROVIDER_SITE_OTHER): Payer: PRIVATE HEALTH INSURANCE | Admitting: Neurology

## 2016-08-31 ENCOUNTER — Encounter: Payer: Self-pay | Admitting: Neurology

## 2016-08-31 VITALS — BP 116/73 | HR 64 | Ht 66.0 in | Wt 188.2 lb

## 2016-08-31 DIAGNOSIS — I824Y9 Acute embolism and thrombosis of unspecified deep veins of unspecified proximal lower extremity: Secondary | ICD-10-CM

## 2016-08-31 DIAGNOSIS — I679 Cerebrovascular disease, unspecified: Secondary | ICD-10-CM | POA: Diagnosis not present

## 2016-08-31 DIAGNOSIS — R413 Other amnesia: Secondary | ICD-10-CM

## 2016-08-31 DIAGNOSIS — I82409 Acute embolism and thrombosis of unspecified deep veins of unspecified lower extremity: Secondary | ICD-10-CM | POA: Insufficient documentation

## 2016-08-31 DIAGNOSIS — R569 Unspecified convulsions: Secondary | ICD-10-CM | POA: Diagnosis not present
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
## 2016-09-12 ENCOUNTER — Other Ambulatory Visit: Payer: Self-pay | Admitting: Adult Health

## 2016-10-10 ENCOUNTER — Other Ambulatory Visit: Payer: Self-pay | Admitting: Adult Health

## 2016-10-13 DIAGNOSIS — I10 Essential (primary) hypertension: Secondary | ICD-10-CM | POA: Diagnosis not present

## 2016-10-24 DIAGNOSIS — D6852 Prothrombin gene mutation: Secondary | ICD-10-CM | POA: Diagnosis not present

## 2016-10-24 DIAGNOSIS — D6861 Antiphospholipid syndrome: Secondary | ICD-10-CM | POA: Diagnosis not present

## 2016-10-26 ENCOUNTER — Encounter (HOSPITAL_COMMUNITY): Payer: Self-pay

## 2016-10-26 ENCOUNTER — Other Ambulatory Visit (HOSPITAL_COMMUNITY): Payer: Self-pay

## 2016-10-26 LAB — POCT INR: INR: 4.3

## 2016-10-30 DIAGNOSIS — D6852 Prothrombin gene mutation: Secondary | ICD-10-CM | POA: Diagnosis not present

## 2016-10-30 DIAGNOSIS — D6861 Antiphospholipid syndrome: Secondary | ICD-10-CM | POA: Diagnosis not present

## 2016-11-02 ENCOUNTER — Encounter (HOSPITAL_COMMUNITY): Payer: Self-pay

## 2016-11-02 LAB — POCT INR: INR: 1.7

## 2016-11-23 ENCOUNTER — Other Ambulatory Visit (HOSPITAL_COMMUNITY): Payer: Self-pay | Admitting: *Deleted

## 2016-11-23 DIAGNOSIS — D6861 Antiphospholipid syndrome: Secondary | ICD-10-CM

## 2016-11-27 ENCOUNTER — Encounter (HOSPITAL_COMMUNITY): Payer: PRIVATE HEALTH INSURANCE

## 2016-11-27 ENCOUNTER — Encounter (HOSPITAL_COMMUNITY): Payer: PRIVATE HEALTH INSURANCE | Attending: Oncology | Admitting: Oncology

## 2016-11-27 ENCOUNTER — Encounter (HOSPITAL_COMMUNITY): Payer: Self-pay | Admitting: Oncology

## 2016-11-27 VITALS — BP 111/68 | HR 64 | Temp 97.6°F | Resp 16 | Wt 187.1 lb

## 2016-11-27 DIAGNOSIS — D6861 Antiphospholipid syndrome: Secondary | ICD-10-CM | POA: Insufficient documentation

## 2016-11-27 DIAGNOSIS — M810 Age-related osteoporosis without current pathological fracture: Secondary | ICD-10-CM | POA: Diagnosis not present

## 2016-11-27 DIAGNOSIS — Z7901 Long term (current) use of anticoagulants: Secondary | ICD-10-CM | POA: Diagnosis not present

## 2016-11-27 DIAGNOSIS — E611 Iron deficiency: Secondary | ICD-10-CM | POA: Diagnosis not present

## 2016-11-27 LAB — IRON AND TIBC
IRON: 55 ug/dL (ref 28–170)
Saturation Ratios: 13 % (ref 10.4–31.8)
TIBC: 414 ug/dL (ref 250–450)
UIBC: 359 ug/dL

## 2016-11-27 LAB — CBC WITH DIFFERENTIAL/PLATELET
Basophils Absolute: 0.1 10*3/uL (ref 0.0–0.1)
Basophils Relative: 1 %
Eosinophils Absolute: 0.2 10*3/uL (ref 0.0–0.7)
Eosinophils Relative: 3 %
HEMATOCRIT: 39.2 % (ref 36.0–46.0)
HEMOGLOBIN: 12.5 g/dL (ref 12.0–15.0)
LYMPHS ABS: 1 10*3/uL (ref 0.7–4.0)
LYMPHS PCT: 17 %
MCH: 30 pg (ref 26.0–34.0)
MCHC: 31.9 g/dL (ref 30.0–36.0)
MCV: 94.2 fL (ref 78.0–100.0)
Monocytes Absolute: 0.7 10*3/uL (ref 0.1–1.0)
Monocytes Relative: 12 %
NEUTROS ABS: 3.9 10*3/uL (ref 1.7–7.7)
NEUTROS PCT: 67 %
Platelets: 207 10*3/uL (ref 150–400)
RBC: 4.16 MIL/uL (ref 3.87–5.11)
RDW: 14.8 % (ref 11.5–15.5)
WBC: 5.8 10*3/uL (ref 4.0–10.5)

## 2016-11-27 LAB — PROTIME-INR
INR: 2.26
Prothrombin Time: 25.3 seconds — ABNORMAL HIGH (ref 11.4–15.2)

## 2016-11-27 LAB — BASIC METABOLIC PANEL
ANION GAP: 9 (ref 5–15)
BUN: 28 mg/dL — ABNORMAL HIGH (ref 6–20)
CALCIUM: 8.9 mg/dL (ref 8.9–10.3)
CHLORIDE: 97 mmol/L — AB (ref 101–111)
CO2: 27 mmol/L (ref 22–32)
Creatinine, Ser: 1.17 mg/dL — ABNORMAL HIGH (ref 0.44–1.00)
GFR calc non Af Amer: 51 mL/min — ABNORMAL LOW (ref >60)
GFR, EST AFRICAN AMERICAN: 59 mL/min — AB (ref >60)
Glucose, Bld: 85 mg/dL (ref 65–99)
Potassium: 2.7 mmol/L — CL (ref 3.5–5.1)
Sodium: 133 mmol/L — ABNORMAL LOW (ref 135–145)

## 2016-11-27 LAB — FERRITIN: Ferritin: 22 ng/mL (ref 11–307)

## 2016-11-27 MED ORDER — WARFARIN SODIUM 1 MG PO TABS: 1.0000 mg | ORAL_TABLET | Freq: Every day | ORAL | 2 refills | Status: DC

## 2016-11-27 MED ORDER — WARFARIN SODIUM 3 MG PO TABS: ORAL_TABLET | ORAL | 2 refills | Status: DC

## 2016-11-27 MED ORDER — POLYSACCHARIDE IRON COMPLEX 150 MG PO CAPS: 150.0000 mg | ORAL_CAPSULE | Freq: Every day | ORAL | 5 refills | Status: DC

## 2016-11-27 NOTE — Assessment & Plan Note (Addendum)
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  Bone density on 03/04/2016 demonstrated: BMD as determined from Forearm Radius 33% is 0.471 g/cm2 with a T-Score of -3.4. This patient is considered osteoporotic according to [facility]) criteria.   She was tried on Fosamax 70 mg weekly which she did not tolerate; complicated with nausea, vomiting, and abdominal pain.   Labs today: CMET, Vit D  Labs in 6 months: CMET, Vit D.  Now on Prolia every 6 months beginning on 06/18/2016.  She is due for Prolia next month and we will use today's labs for treatment criteria that day.  If Ca++ is normal, then we will proceed with Prolia as planned.  If Ca++ is low, then the patient will be given further directions and we will recheck Ca++ prior to Prolia injection.

## 2016-11-27 NOTE — Assessment & Plan Note (Addendum)
Thrombophilia manifesting as antiphospholipid antibody, tolerating warfarin well without any VTE while on vitamin K antagonist therapy.  Labs today: CBC diff, BMET, PT/INR.  I personally reviewed and went over laboratory results with the patient.  The results are noted within this dictation.   Continue Coumadin with INR monitoring as directed.   I have refilled her Coumadin.  I personally reviewed and went over radiographic studies with the patient.  The results are noted within this dictation.  Mammogram on 03/06/2016 as BI-RADS Category 1.  She is scheduled for labs with Pablo Lawrence, NP (PCP) on Monday which will essentially be repeated labs planned for today.  I communicated this to [facility] and she will cancel the patient's appointment for lab work on Monday.  I will send her a copy of lab results.  Return in 6 months for follow-up.

## 2016-11-27 NOTE — Progress Notes (Signed)
CRITICAL VALUE ALERT Critical value received:  K+ 2.7 Date of notification:  11/27/16 Time of notification: [id number] Critical value read back:  Yes.   Nurse who received alert:  M.Kylyn Sookram, LPN MD notified (1st page):  T.Kefalas, PA-C  Tom said patients PCP is monitoring her potassium levels. Called PCP's office and gave message to Caryl Pina that patients K+ was 2.7. She verbalized understanding and said she will let Loma Sousa know.

## 2016-11-27 NOTE — Assessment & Plan Note (Addendum)
Iron deficiency without anemia, requiring IV iron replacement. Now on by mouth iron replacement.  Labs today: CBC diff, iron/TIBC, ferritin.  I personally reviewed and went over laboratory results with the patient.  The results are noted within this dictation.    Labs in 6 months: CBC with differential, iron/TIBC, ferritin.   I refilled her PO iron today too.

## 2016-11-27 NOTE — Patient Instructions (Addendum)
[place] at [facility] Discharge Instructions  RECOMMENDATIONS MADE BY THE CONSULTANT AND ANY TEST RESULTS WILL BE SENT TO YOUR REFERRING PHYSICIAN.  You were seen by Gershon Mussel today. Prolia in 1 month Follow Up in 7 months with labs and Prolia   Thank you for choosing [place] at [facility] to provide your oncology and hematology care.  To afford each patient quality time with our provider, please arrive at least 15 minutes before your scheduled appointment time.   Beginning January 23rd 2017 lab work for the [facility] will be done in the  Main lab at [facility] on 1st floor. If you have a lab appointment with the [facility] please come in thru the  Main Entrance and check in at the main information desk  You need to re-schedule your appointment should you arrive 10 or more minutes late.  We strive to give you quality time with our providers, and arriving late affects you and other patients whose appointments are after yours.  Also, if you no show three or more times for appointments you may be dismissed from the clinic at the providers discretion.     Again, thank you for choosing [facility].  Our hope is that these requests will decrease the amount of time that you wait before being seen by our physicians.       _____________________________________________________________  Should you have questions after your visit to [facility], please contact our office at [phone] between the hours of 8:30 a.m. and 4:30 p.m.  Voicemails left after 4:30 p.m. will not be returned until the following business day.  For prescription refill requests, have your pharmacy contact our office.         Resources For Cancer Patients and their Caregivers ? American Cancer Society: Can assist with transportation, wigs, general needs, runs Look Good Feel Better.        856-405-1847 ? Cancer Care: Provides financial  assistance, online support groups, medication/co-pay assistance.  1-800-813-HOPE (862) 225-7987) ? [facility] Assists [place] cancer patients and their families through emotional , educational and financial support.  215-008-4981 ? [place] DSS Where to apply for food stamps, Medicaid and utility assistance. 669 859 8238 ? RCATS: Transportation to medical appointments. (585)830-8928 ? Social Security Administration: May apply for disability if have a Stage IV cancer. 859-469-9081 (206) [phone] ? [employer], Disability and Transit Services: Assists with nutrition, care and transit needs. [place] Support Programs: @10RELATIVEDAYS @ > Cancer Support Group  2nd Tuesday of the month 1pm-2pm, Journey Room  > Creative Journey  3rd Tuesday of the month 1130am-1pm, Journey Room  > Look Good Feel Better  1st Wednesday of the month 10am-12 noon, Journey Room (Call [place] to register [phone])
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
## 2016-11-28 LAB — VITAMIN D 25 HYDROXY (VIT D DEFICIENCY, FRACTURES): VIT D 25 HYDROXY: 27.7 ng/mL — AB (ref 30.0–100.0)

## 2016-11-30 ENCOUNTER — Other Ambulatory Visit (HOSPITAL_COMMUNITY): Payer: Self-pay | Admitting: Oncology

## 2016-11-30 DIAGNOSIS — R7989 Other specified abnormal findings of blood chemistry: Secondary | ICD-10-CM

## 2016-11-30 MED ORDER — ERGOCALCIFEROL 1.25 MG (50000 UT) PO CAPS: 50000.0000 [IU] | ORAL_CAPSULE | ORAL | 0 refills | Status: DC

## 2016-12-09 ENCOUNTER — Encounter (HOSPITAL_COMMUNITY): Payer: Self-pay

## 2016-12-09 ENCOUNTER — Encounter (HOSPITAL_BASED_OUTPATIENT_CLINIC_OR_DEPARTMENT_OTHER): Payer: PRIVATE HEALTH INSURANCE

## 2016-12-09 ENCOUNTER — Other Ambulatory Visit (HOSPITAL_COMMUNITY): Payer: Self-pay

## 2016-12-09 VITALS — BP 103/76 | HR 70 | Temp 98.0°F | Resp 16

## 2016-12-09 DIAGNOSIS — E611 Iron deficiency: Secondary | ICD-10-CM

## 2016-12-09 DIAGNOSIS — M810 Age-related osteoporosis without current pathological fracture: Secondary | ICD-10-CM

## 2016-12-09 MED ORDER — SODIUM CHLORIDE 0.9 % IV SOLN
Freq: Once | INTRAVENOUS | Status: AC
  Administered 2016-12-09: 14:00:00 via INTRAVENOUS

## 2016-12-09 MED ORDER — SODIUM CHLORIDE 0.9 % IV SOLN
510.0000 mg | Freq: Once | INTRAVENOUS | Status: AC
  Administered 2016-12-09: 510 mg via INTRAVENOUS
  Filled 2016-12-09: qty 17

## 2016-12-09 NOTE — Patient Instructions (Signed)
[facility] at [facility] Discharge Instructions  RECOMMENDATIONS MADE BY THE CONSULTANT AND ANY TEST RESULTS WILL BE SENT TO YOUR REFERRING PHYSICIAN.  IV iron today.    Thank you for choosing [facility] at [facility] to provide your oncology and hematology care.  To afford each patient quality time with our provider, please arrive at least 15 minutes before your scheduled appointment time.   Beginning January 23rd 2017 lab work for the Cancer Center will be done in the  Main lab at [facility] on 1st floor. If you have a lab appointment with the Cancer Center please come in thru the  Main Entrance and check in at the main information desk  You need to re-schedule your appointment should you arrive 10 or more minutes late.  We strive to give you quality time with our providers, and arriving late affects you and other patients whose appointments are after yours.  Also, if you no show three or more times for appointments you may be dismissed from the clinic at the providers discretion.     Again, thank you for choosing  Cancer Center.  Our hope is that these requests will decrease the amount of time that you wait before being seen by our physicians.       _____________________________________________________________  Should you have questions after your visit to  Cancer Center, please contact our office at [phone] between the hours of 8:30 a.m. and 4:30 p.m.  Voicemails left after 4:30 p.m. will not be returned until the following business day.  For prescription refill requests, have your pharmacy contact our office.         Resources For Cancer Patients and their Caregivers ? American Cancer Society: Can assist with transportation, wigs, general needs, runs Look Good Feel Better.        1-888-227-6333 ? Cancer Care: Provides financial assistance, online support groups, medication/co-pay assistance.  1-800-813-HOPE  (4673) ? [facility] Assists [place] cancer patients and their families through emotional , educational and financial support.  336-427-4357 ? [place] DSS Where to apply for food stamps, Medicaid and utility assistance. 336-342-1394 ? RCATS: Transportation to medical appointments. 336-347-2287 ? Social Security Administration: May apply for disability if have a Stage IV cancer. 336-342-7796 1-800-772-1213 ? [place] Aging, Disability and Transit Services: Assists with nutrition, care and transit needs. 336-349-2343  [facility] Center Support Programs: @10RELATIVEDAYS@ > Cancer Support Group  2nd Tuesday of the month 1pm-2pm, Journey Room  > Creative Journey  3rd Tuesday of the month 1130am-1pm, Journey Room  > Look Good Feel Better  1st Wednesday of the month 10am-12 noon, Journey Room (Call American [employer] to register [phone])    

## 2016-12-09 NOTE — Progress Notes (Signed)
Patient tolerated infusion well.  VSS.  Patient ambulatory and stable upon discharge from clinic.   

## 2016-12-11 ENCOUNTER — Ambulatory Visit (INDEPENDENT_AMBULATORY_CARE_PROVIDER_SITE_OTHER): Payer: BLUE CROSS/BLUE SHIELD | Admitting: Adult Health

## 2016-12-11 ENCOUNTER — Encounter: Payer: Self-pay | Admitting: Adult Health

## 2016-12-11 VITALS — BP 98/60 | HR 69 | Ht 65.0 in | Wt 186.0 lb

## 2016-12-11 DIAGNOSIS — F419 Anxiety disorder, unspecified: Secondary | ICD-10-CM | POA: Diagnosis not present

## 2016-12-11 DIAGNOSIS — Z1212 Encounter for screening for malignant neoplasm of rectum: Secondary | ICD-10-CM | POA: Diagnosis not present

## 2016-12-11 DIAGNOSIS — Z01411 Encounter for gynecological examination (general) (routine) with abnormal findings: Secondary | ICD-10-CM

## 2016-12-11 DIAGNOSIS — R944 Abnormal results of kidney function studies: Secondary | ICD-10-CM | POA: Diagnosis not present

## 2016-12-11 DIAGNOSIS — Z01419 Encounter for gynecological examination (general) (routine) without abnormal findings: Secondary | ICD-10-CM | POA: Insufficient documentation

## 2016-12-11 DIAGNOSIS — Z1211 Encounter for screening for malignant neoplasm of colon: Secondary | ICD-10-CM

## 2016-12-11 LAB — HEMOCCULT GUIAC POC 1CARD (OFFICE): FECAL OCCULT BLD: NEGATIVE

## 2016-12-11 MED ORDER — HYDROCHLOROTHIAZIDE 25 MG PO TABS: ORAL_TABLET | ORAL | 1 refills | Status: DC

## 2016-12-11 MED ORDER — CITALOPRAM HYDROBROMIDE 40 MG PO TABS: ORAL_TABLET | ORAL | 4 refills | Status: DC

## 2016-12-11 NOTE — Progress Notes (Signed)
Patient ID: Samantha Fields, female   DOB: [birth year], 55 y.o.   MRN: [id number] History of Present Illness: Samantha Fields is a 55 year old white female, married in for well woman gyn exam, she is sp hysterectomy.She had iron infusion Wednesday and has had low vitamin D and K levels, has seen Dr Whitney Muse. She sees Dr Nevada Crane this afternoon to review labs.  PCP is Dr Nevada Crane.  Current Medications, Allergies, Past Medical History, Past Surgical History, Family History and Social History were reviewed in [employer] record.     Review of Systems: Patient denies any headaches, hearing loss, fatigue, blurred vision, shortness of breath, chest pain, abdominal pain, problems with urination, or intercourse. No joint pain or mood swings. Having BMs after eating and has indigestion often, esp at night.Feels drier in vaginal area at times.   Physical Exam:BP 98/60 (BP Location: Left Arm, Patient Position: Sitting, Cuff Size: Normal)   Pulse 69   Ht 5\' 5"  (1.651 m)   Wt 186 lb (84.4 kg)   BMI 30.95 kg/m  General:  Well developed, well nourished, no acute distress Skin:  Warm and dry Neck:  Midline trachea, normal thyroid, good ROM, no lymphadenopathy Lungs; Clear to auscultation bilaterally Breast:  No dominant palpable mass, retraction, or nipple discharge Cardiovascular: Regular rate and rhythm Abdomen:  Soft, non tender, no hepatosplenomegaly Pelvic:  External genitalia is normal in appearance, no lesions.  The vagina is normal in appearance. Urethra has no lesions or masses. The cervix and uterus are absent. No adnexal masses or tenderness noted.Bladder is non tender, no masses felt. Rectal: Good sphincter tone, no polyps, or hemorrhoids felt.  Hemoccult negative. Extremities/musculoskeletal:  No swelling, has lots of spider veins, no clubbing or cyanosis Psych:  No mood changes, alert and cooperative,seems happy PHQ 2 score 0.Encouraged to see GI, will talk with Dr. Nevada Crane.    Impression: 1. Well woman exam with routine gynecological exam   2. Anxiety   3. Screening for colorectal cancer       Plan: Refilled celexa 40 mg #90 take 1 daily with 4 refills Refilled hydrodiuril 25 mg #90 take 1 prn swelling in feet and legs with 1 refill Physical in 1 year Mammogram yearly Colonoscopy advised Labs at PCP Try Luvena for vaginal moisture and astroglide for sex

## 2016-12-11 NOTE — Patient Instructions (Addendum)
Physical in 1 year Mammogram yearly Colonoscopy advised Labs at PCP

## 2016-12-22 ENCOUNTER — Encounter (HOSPITAL_COMMUNITY): Payer: Self-pay

## 2016-12-22 ENCOUNTER — Encounter (HOSPITAL_COMMUNITY): Payer: PRIVATE HEALTH INSURANCE | Attending: Oncology

## 2016-12-22 VITALS — BP 109/65 | HR 70 | Resp 16

## 2016-12-22 DIAGNOSIS — M810 Age-related osteoporosis without current pathological fracture: Secondary | ICD-10-CM

## 2016-12-22 DIAGNOSIS — E611 Iron deficiency: Secondary | ICD-10-CM | POA: Insufficient documentation

## 2016-12-22 DIAGNOSIS — D6861 Antiphospholipid syndrome: Secondary | ICD-10-CM | POA: Insufficient documentation

## 2016-12-22 MED ORDER — DENOSUMAB 60 MG/ML ~~LOC~~ SOLN
60.0000 mg | Freq: Once | SUBCUTANEOUS | Status: AC
  Administered 2016-12-22: 60 mg via SUBCUTANEOUS
  Filled 2016-12-22: qty 1

## 2016-12-22 NOTE — Patient Instructions (Signed)
[place] at [facility] Discharge Instructions  RECOMMENDATIONS MADE BY THE CONSULTANT AND ANY TEST RESULTS WILL BE SENT TO YOUR REFERRING PHYSICIAN.  Prolia injection.    Thank you for choosing [place] at [facility] to provide your oncology and hematology care.  To afford each patient quality time with our provider, please arrive at least 15 minutes before your scheduled appointment time.    If you have a lab appointment with the [facility] please come in thru the  Main Entrance and check in at the main information desk  You need to re-schedule your appointment should you arrive 10 or more minutes late.  We strive to give you quality time with our providers, and arriving late affects you and other patients whose appointments are after yours.  Also, if you no show three or more times for appointments you may be dismissed from the clinic at the providers discretion.     Again, thank you for choosing [facility].  Our hope is that these requests will decrease the amount of time that you wait before being seen by our physicians.       _____________________________________________________________  Should you have questions after your visit to [facility], please contact our office at [phone] between the hours of 8:30 a.m. and 4:30 p.m.  Voicemails left after 4:30 p.m. will not be returned until the following business day.  For prescription refill requests, have your pharmacy contact our office.       Resources For Cancer Patients and their Caregivers ? American Cancer Society: Can assist with transportation, wigs, general needs, runs Look Good Feel Better.        4506854580 ? Cancer Care: Provides financial assistance, online support groups, medication/co-pay assistance.  1-800-813-HOPE (605)723-8122) ? [facility] Assists [place] cancer patients and their families through  emotional , educational and financial support.  778-315-9327 ? [place] DSS Where to apply for food stamps, Medicaid and utility assistance. 952-212-1900 ? RCATS: Transportation to medical appointments. 972-243-9070 ? Social Security Administration: May apply for disability if have a Stage IV cancer. 878 836 0355 [phone] ? [employer], Disability and Transit Services: Assists with nutrition, care and transit needs. [place] Support Programs: @10RELATIVEDAYS @ > Cancer Support Group  2nd Tuesday of the month 1pm-2pm, Journey Room  > Creative Journey  3rd Tuesday of the month 1130am-1pm, Journey Room  > Look Good Feel Better  1st Wednesday of the month 10am-12 noon, Journey Room (Call [place] to register [phone])

## 2016-12-22 NOTE — Progress Notes (Signed)
Samantha Fields presents today for injection per MD orders. Prolia administered SQ in right Abdomen. Administration without incident. Patient tolerated well.

## 2017-02-03 ENCOUNTER — Other Ambulatory Visit: Payer: Self-pay | Admitting: Neurology

## 2017-03-18 DIAGNOSIS — D6861 Antiphospholipid syndrome: Secondary | ICD-10-CM | POA: Diagnosis not present

## 2017-03-18 DIAGNOSIS — D6852 Prothrombin gene mutation: Secondary | ICD-10-CM | POA: Diagnosis not present

## 2017-03-19 ENCOUNTER — Encounter (HOSPITAL_COMMUNITY): Payer: Self-pay

## 2017-03-19 ENCOUNTER — Other Ambulatory Visit (HOSPITAL_COMMUNITY): Payer: Self-pay | Admitting: Oncology

## 2017-03-19 DIAGNOSIS — R7989 Other specified abnormal findings of blood chemistry: Secondary | ICD-10-CM

## 2017-03-19 LAB — POCT INR: INR: 1.8

## 2017-03-19 NOTE — Patient Instructions (Signed)
Notified patient to increase coumadin to 4 mg for 1 week then recheck INR. She verbalized understanding.

## 2017-03-26 ENCOUNTER — Other Ambulatory Visit (HOSPITAL_COMMUNITY): Payer: Self-pay | Admitting: Oncology

## 2017-03-26 DIAGNOSIS — D6861 Antiphospholipid syndrome: Secondary | ICD-10-CM

## 2017-03-31 ENCOUNTER — Other Ambulatory Visit (HOSPITAL_COMMUNITY): Payer: Self-pay

## 2017-03-31 DIAGNOSIS — I824Y9 Acute embolism and thrombosis of unspecified deep veins of unspecified proximal lower extremity: Secondary | ICD-10-CM | POA: Diagnosis not present

## 2017-04-01 ENCOUNTER — Encounter (HOSPITAL_COMMUNITY): Payer: Self-pay

## 2017-04-01 ENCOUNTER — Telehealth (HOSPITAL_COMMUNITY): Payer: Self-pay

## 2017-04-01 DIAGNOSIS — I824Y9 Acute embolism and thrombosis of unspecified deep veins of unspecified proximal lower extremity: Secondary | ICD-10-CM

## 2017-04-01 LAB — POCT INR: INR: 3.6

## 2017-04-01 NOTE — Telephone Encounter (Signed)
Patient called stating she needed a new standing order for PT/INR. Hers will expire on May 3. She also needs it for weekly instead of monthly. Reviewed with PA-C. New order sent to [facility] lab.

## 2017-05-25 DIAGNOSIS — J060 Acute laryngopharyngitis: Secondary | ICD-10-CM | POA: Diagnosis not present

## 2017-06-18 ENCOUNTER — Encounter: Payer: Self-pay | Admitting: Hematology

## 2017-06-18 LAB — PROTIME-INR

## 2017-06-24 ENCOUNTER — Telehealth (HOSPITAL_COMMUNITY): Payer: Self-pay | Admitting: *Deleted

## 2017-06-24 NOTE — Telephone Encounter (Signed)
We received outside lab results from [facility] for  patient's PT/INR. PT 35.0 seconds INR 3.5.  Lab results reviewed with Kirby Crigler, PA and orders received for patient to hold coumadin for tonight only.  Patient to continue same dose coumadin tomorrow evening.  Patient needs to have an appointment for lab work in 2 weeks to have PT/INR redrawn.  Attempted to call patient and at this time I can not reach her.

## 2017-06-24 NOTE — Telephone Encounter (Signed)
LMOM for patient to return call to receive instructions on Coumadin dosage.

## 2017-06-25 ENCOUNTER — Telehealth (HOSPITAL_COMMUNITY): Payer: Self-pay | Admitting: *Deleted

## 2017-06-25 NOTE — Telephone Encounter (Signed)
Patient returned call and states that she will hold her coumadin dose tonight and she is already scheduled to come in for lab work on Monday.  She states that she will wait for those results to move forward.

## 2017-06-28 ENCOUNTER — Encounter (HOSPITAL_COMMUNITY): Payer: BLUE CROSS/BLUE SHIELD | Attending: Oncology

## 2017-06-28 ENCOUNTER — Encounter (HOSPITAL_COMMUNITY): Payer: Self-pay | Admitting: Oncology

## 2017-06-28 ENCOUNTER — Ambulatory Visit (HOSPITAL_COMMUNITY): Payer: BLUE CROSS/BLUE SHIELD

## 2017-06-28 ENCOUNTER — Encounter (HOSPITAL_BASED_OUTPATIENT_CLINIC_OR_DEPARTMENT_OTHER): Payer: BLUE CROSS/BLUE SHIELD | Admitting: Oncology

## 2017-06-28 VITALS — BP 108/70 | HR 60 | Temp 97.8°F | Resp 18 | Wt 185.0 lb

## 2017-06-28 DIAGNOSIS — E611 Iron deficiency: Secondary | ICD-10-CM | POA: Insufficient documentation

## 2017-06-28 DIAGNOSIS — M810 Age-related osteoporosis without current pathological fracture: Secondary | ICD-10-CM

## 2017-06-28 DIAGNOSIS — D6861 Antiphospholipid syndrome: Secondary | ICD-10-CM | POA: Insufficient documentation

## 2017-06-28 DIAGNOSIS — E876 Hypokalemia: Secondary | ICD-10-CM

## 2017-06-28 LAB — COMPREHENSIVE METABOLIC PANEL
ALBUMIN: 3.9 g/dL (ref 3.5–5.0)
ALK PHOS: 85 U/L (ref 38–126)
ALT: 22 U/L (ref 14–54)
AST: 30 U/L (ref 15–41)
Anion gap: 10 (ref 5–15)
BUN: 19 mg/dL (ref 6–20)
CALCIUM: 9.2 mg/dL (ref 8.9–10.3)
CHLORIDE: 96 mmol/L — AB (ref 101–111)
CO2: 29 mmol/L (ref 22–32)
CREATININE: 1.18 mg/dL — AB (ref 0.44–1.00)
GFR calc non Af Amer: 50 mL/min — ABNORMAL LOW (ref >60)
GFR, EST AFRICAN AMERICAN: 58 mL/min — AB (ref >60)
GLUCOSE: 93 mg/dL (ref 65–99)
Potassium: 3 mmol/L — ABNORMAL LOW (ref 3.5–5.1)
SODIUM: 135 mmol/L (ref 135–145)
Total Bilirubin: 1 mg/dL (ref 0.3–1.2)
Total Protein: 7.1 g/dL (ref 6.5–8.1)

## 2017-06-28 LAB — CBC WITH DIFFERENTIAL/PLATELET
Basophils Absolute: 0.1 10*3/uL (ref 0.0–0.1)
Basophils Relative: 1 %
EOS ABS: 0.2 10*3/uL (ref 0.0–0.7)
EOS PCT: 2 %
HCT: 40.6 % (ref 36.0–46.0)
HEMOGLOBIN: 13.4 g/dL (ref 12.0–15.0)
LYMPHS ABS: 1.4 10*3/uL (ref 0.7–4.0)
Lymphocytes Relative: 20 %
MCH: 31.1 pg (ref 26.0–34.0)
MCHC: 33 g/dL (ref 30.0–36.0)
MCV: 94.2 fL (ref 78.0–100.0)
Monocytes Absolute: 0.6 10*3/uL (ref 0.1–1.0)
Monocytes Relative: 9 %
NEUTROS PCT: 68 %
Neutro Abs: 4.8 10*3/uL (ref 1.7–7.7)
PLATELETS: 212 10*3/uL (ref 150–400)
RBC: 4.31 MIL/uL (ref 3.87–5.11)
RDW: 13.7 % (ref 11.5–15.5)
WBC: 7.1 10*3/uL (ref 4.0–10.5)

## 2017-06-28 LAB — PROTIME-INR
INR: 1.71
Prothrombin Time: 20.3 seconds — ABNORMAL HIGH (ref 11.4–15.2)

## 2017-06-28 LAB — FERRITIN: Ferritin: 86 ng/mL (ref 11–307)

## 2017-06-28 MED ORDER — DENOSUMAB 60 MG/ML ~~LOC~~ SOLN
60.0000 mg | Freq: Once | SUBCUTANEOUS | Status: AC
  Administered 2017-06-28: 60 mg via SUBCUTANEOUS
  Filled 2017-06-28: qty 1

## 2017-06-28 MED ORDER — POTASSIUM CHLORIDE CRYS ER 20 MEQ PO TBCR: 20.0000 meq | EXTENDED_RELEASE_TABLET | Freq: Two times a day (BID) | ORAL | 2 refills | Status: DC

## 2017-06-28 NOTE — Progress Notes (Signed)
Samantha Fields presents today for injection per the provider's orders.  Prolia administration without incident; see MAR for injection details.  Patient tolerated procedure well and without incident.  No questions or complaints noted at this time.  Discharged ambulatory.  

## 2017-06-28 NOTE — Patient Instructions (Signed)
[place] at [facility] Discharge Instructions  RECOMMENDATIONS MADE BY THE CONSULTANT AND ANY TEST RESULTS WILL BE SENT TO YOUR REFERRING PHYSICIAN.  Increase Coumadin to 4 mg tonight, 4 mg tomorrow, and then back to 3.5, 3.5, 4 mg repeating. Prolia injection in 6 months.  Continue with Calcium and Vit D. Kdur is escribed to your pharmacy.  Please take 1-2 tablets of Potassium when you take your fluid pill (diuretic).   Return in 6 months for follow-up.  Thank you for choosing [place] at [facility] to provide your oncology and hematology care.  To afford each patient quality time with our provider, please arrive at least 15 minutes before your scheduled appointment time.    If you have a lab appointment with the [facility] please come in thru the  Main Entrance and check in at the main information desk  You need to re-schedule your appointment should you arrive 10 or more minutes late.  We strive to give you quality time with our providers, and arriving late affects you and other patients whose appointments are after yours.  Also, if you no show three or more times for appointments you may be dismissed from the clinic at the providers discretion.     Again, thank you for choosing [facility].  Our hope is that these requests will decrease the amount of time that you wait before being seen by our physicians.       _____________________________________________________________  Should you have questions after your visit to [facility], please contact our office at [phone] between the hours of 8:30 a.m. and 4:30 p.m.  Voicemails left after 4:30 p.m. will not be returned until the following business day.  For prescription refill requests, have your pharmacy contact our office.       Resources For Cancer Patients and their Caregivers ? American Cancer Society: Can assist with transportation, wigs, general  needs, runs Look Good Feel Better.        339-385-8485 ? Cancer Care: Provides financial assistance, online support groups, medication/co-pay assistance.  1-800-813-HOPE (628)815-7425) ? [place] Assists [place] cancer patients and their families through emotional , educational and financial support.  (220)511-7804 ? [place] DSS Where to apply for food stamps, Medicaid and utility assistance. 930 784 4535 ? [facility]: Transportation to medical appointments. 9180908903 ? Social Security Administration: May apply for disability if have a Stage IV cancer. 929-002-0930 726-439-9991 ? [employer], Disability and Transit Services: Assists with nutrition, care and transit needs. [place] Support Programs: @10RELATIVEDAYS @ > Cancer Support Group  2nd Tuesday of the month 1pm-2pm, Journey Room  > Creative Journey  3rd Tuesday of the month 1130am-1pm, Journey Room  > Look Good Feel Better  1st Wednesday of the month 10am-12 noon, Journey Room (Call [place] to register [phone])

## 2017-06-28 NOTE — Assessment & Plan Note (Addendum)
Thrombophilia manifesting as antiphospholipid antibody, tolerating warfarin well without any VTE while on vitamin K antagonist therapy.  Labs today: CBC differential, CMET, PT/INR.  I personally reviewed and went over laboratory results with the patient.  The results are noted within this dictation.  K+ is low, likely secondary to HCTZ use.  I have prescribed Kdur 40 mEq daily.  INR is 1.71 after a dose adjustment for a supra therapeutic INR while on an antibiotic.   She will take 4 mg tonight and tomorrow of Coumadin, then back to 3.5, 3.5, 4 mg repeating.   INR on Saturday.  Return in 6 months for follow-up.

## 2017-06-28 NOTE — Assessment & Plan Note (Signed)
Iron deficiency without anemia, requiring IV iron replacement.  Now on oral iron replacement with good tolerance.    Labs today: CBC diff, CMET, iron/TIBC, ferritin.  Labs in 6 months: CBC diff, CMET, iron/TIBC, ferritin.
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
## 2017-06-28 NOTE — Assessment & Plan Note (Signed)
Osteoporosis in postmenopausal state.  On Prolia every 6 months, calcium, and vitamin D.  She initially tried Fosamax but due to intolerance (nausea, vomiting, and abdominal pain) medication was discontinued.   Prolia injection administered today. Next injection is due in 6 months.  She will be due for her next bone density exam in 2019.

## 2017-06-29 ENCOUNTER — Encounter (HOSPITAL_COMMUNITY): Payer: Self-pay

## 2017-06-29 LAB — IRON AND TIBC
IRON: 75 ug/dL (ref 28–170)
SATURATION RATIOS: 21 % (ref 10.4–31.8)
TIBC: 360 ug/dL (ref 250–450)
UIBC: 285 ug/dL

## 2017-06-29 LAB — POCT INR: INR: 3.5

## 2017-06-30 LAB — VITAMIN D 25 HYDROXY (VIT D DEFICIENCY, FRACTURES): Vit D, 25-Hydroxy: 44.3 ng/mL (ref 30.0–100.0)

## 2017-07-09 ENCOUNTER — Other Ambulatory Visit: Payer: Self-pay | Admitting: Neurology

## 2017-07-10 LAB — PROTIME-INR

## 2017-07-11 ENCOUNTER — Other Ambulatory Visit: Payer: Self-pay | Admitting: Neurology

## 2017-07-12 ENCOUNTER — Telehealth (HOSPITAL_COMMUNITY): Payer: Self-pay | Admitting: *Deleted

## 2017-07-12 ENCOUNTER — Other Ambulatory Visit: Payer: Self-pay | Admitting: *Deleted

## 2017-07-12 MED ORDER — TOPIRAMATE ER 100 MG PO CAP24: 2.0000 | ORAL_CAPSULE | Freq: Every day | ORAL | 3 refills | Status: DC

## 2017-07-12 NOTE — Telephone Encounter (Signed)
LMOM for pt to continue taking the same dose of coumadin and recheck her INR in 4 weeks. PT was 23.6 on 07/10/17 INR was 2.2 on 07/10/17

## 2017-08-02 ENCOUNTER — Other Ambulatory Visit (HOSPITAL_COMMUNITY): Payer: Self-pay | Admitting: Oncology

## 2017-08-02 DIAGNOSIS — E611 Iron deficiency: Secondary | ICD-10-CM

## 2017-08-31 ENCOUNTER — Ambulatory Visit (INDEPENDENT_AMBULATORY_CARE_PROVIDER_SITE_OTHER): Payer: BLUE CROSS/BLUE SHIELD | Admitting: Neurology

## 2017-08-31 ENCOUNTER — Encounter: Payer: Self-pay | Admitting: Neurology

## 2017-08-31 VITALS — BP 111/76 | HR 62 | Ht 65.0 in | Wt 182.0 lb

## 2017-08-31 DIAGNOSIS — R413 Other amnesia: Secondary | ICD-10-CM

## 2017-08-31 DIAGNOSIS — R569 Unspecified convulsions: Secondary | ICD-10-CM | POA: Diagnosis not present

## 2017-08-31 DIAGNOSIS — I824Y9 Acute embolism and thrombosis of unspecified deep veins of unspecified proximal lower extremity: Secondary | ICD-10-CM

## 2017-08-31 MED ORDER — TOPIRAMATE ER 100 MG PO CAP24: 2.0000 | ORAL_CAPSULE | Freq: Every day | ORAL | 4 refills | Status: DC
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
## 2017-09-20 DIAGNOSIS — M5030 Other cervical disc degeneration, unspecified cervical region: Secondary | ICD-10-CM | POA: Diagnosis not present

## 2017-09-20 DIAGNOSIS — M7551 Bursitis of right shoulder: Secondary | ICD-10-CM | POA: Diagnosis not present

## 2017-09-20 DIAGNOSIS — M5412 Radiculopathy, cervical region: Secondary | ICD-10-CM | POA: Diagnosis not present

## 2017-11-23 ENCOUNTER — Other Ambulatory Visit (HOSPITAL_COMMUNITY): Payer: Self-pay | Admitting: *Deleted

## 2017-11-23 DIAGNOSIS — D6861 Antiphospholipid syndrome: Secondary | ICD-10-CM

## 2017-11-23 MED ORDER — WARFARIN SODIUM 3 MG PO TABS: ORAL_TABLET | ORAL | 2 refills | Status: DC

## 2017-11-23 MED ORDER — WARFARIN SODIUM 1 MG PO TABS: 1.0000 mg | ORAL_TABLET | Freq: Every day | ORAL | 2 refills | Status: DC

## 2017-11-26 ENCOUNTER — Telehealth (HOSPITAL_COMMUNITY): Payer: Self-pay

## 2017-11-26 ENCOUNTER — Encounter (HOSPITAL_COMMUNITY): Payer: Self-pay

## 2017-11-26 DIAGNOSIS — I824Y9 Acute embolism and thrombosis of unspecified deep veins of unspecified proximal lower extremity: Secondary | ICD-10-CM | POA: Diagnosis not present

## 2017-11-26 LAB — POCT INR: INR: 3.8

## 2017-11-26 LAB — PROTIME-INR

## 2017-11-26 NOTE — Telephone Encounter (Signed)
[facility] INR from [facility] is 3.8. Per Dr. Talbert Cage, patient needs to hold coumadin x 2 days and restart at same dose on 3rd day. Recheck INR in 1 week. Attempted to reach patient on cell phone. There was no answer and unable to leave a message. Called patient on home phone. Could not get an answer but was able to leave a message with the above instructions. She is to call the cancer center if any questions.

## 2017-12-02 ENCOUNTER — Other Ambulatory Visit: Payer: Self-pay | Admitting: Adult Health

## 2017-12-06 DIAGNOSIS — I824Y9 Acute embolism and thrombosis of unspecified deep veins of unspecified proximal lower extremity: Secondary | ICD-10-CM | POA: Diagnosis not present

## 2017-12-06 LAB — POCT INR
INR: 3.3 — ABNORMAL HIGH
PT: 34.2 — ABNORMAL HIGH

## 2017-12-08 ENCOUNTER — Encounter (HOSPITAL_COMMUNITY): Payer: Self-pay | Admitting: *Deleted

## 2017-12-08 NOTE — Progress Notes (Signed)
Mike Craze, NP aware of PT/INR results, patient is to decrease coumadin dose to 3.5mg  daily. She needs to recheck labs in 2 weeks.    I left message on patient's personal cell phone regarding decrease in dose and advised patient to return call to clinic should she have any questions.

## 2017-12-28 ENCOUNTER — Other Ambulatory Visit (HOSPITAL_COMMUNITY): Payer: Self-pay

## 2017-12-28 ENCOUNTER — Telehealth (HOSPITAL_COMMUNITY): Payer: Self-pay

## 2017-12-28 DIAGNOSIS — E611 Iron deficiency: Secondary | ICD-10-CM

## 2017-12-28 DIAGNOSIS — D6861 Antiphospholipid syndrome: Secondary | ICD-10-CM

## 2017-12-28 DIAGNOSIS — I824Y9 Acute embolism and thrombosis of unspecified deep veins of unspecified proximal lower extremity: Secondary | ICD-10-CM

## 2017-12-28 NOTE — Telephone Encounter (Signed)
Called patient to verify that she had followed instructions given for coumadin at the end of December. She had and comes tomorrow for lab re check. Labs entered and linked.

## 2017-12-29 ENCOUNTER — Inpatient Hospital Stay (HOSPITAL_COMMUNITY): Payer: BLUE CROSS/BLUE SHIELD

## 2017-12-29 ENCOUNTER — Other Ambulatory Visit: Payer: Self-pay

## 2017-12-29 ENCOUNTER — Encounter (HOSPITAL_COMMUNITY): Payer: Self-pay | Admitting: Hematology and Oncology

## 2017-12-29 ENCOUNTER — Ambulatory Visit (HOSPITAL_COMMUNITY): Payer: BLUE CROSS/BLUE SHIELD

## 2017-12-29 ENCOUNTER — Inpatient Hospital Stay (HOSPITAL_COMMUNITY): Payer: BLUE CROSS/BLUE SHIELD | Attending: Oncology

## 2017-12-29 ENCOUNTER — Inpatient Hospital Stay (HOSPITAL_BASED_OUTPATIENT_CLINIC_OR_DEPARTMENT_OTHER): Payer: BLUE CROSS/BLUE SHIELD | Admitting: Hematology and Oncology

## 2017-12-29 ENCOUNTER — Other Ambulatory Visit (HOSPITAL_COMMUNITY): Payer: BLUE CROSS/BLUE SHIELD

## 2017-12-29 DIAGNOSIS — M810 Age-related osteoporosis without current pathological fracture: Secondary | ICD-10-CM | POA: Diagnosis not present

## 2017-12-29 DIAGNOSIS — E876 Hypokalemia: Secondary | ICD-10-CM

## 2017-12-29 DIAGNOSIS — D509 Iron deficiency anemia, unspecified: Secondary | ICD-10-CM | POA: Insufficient documentation

## 2017-12-29 DIAGNOSIS — Z7901 Long term (current) use of anticoagulants: Secondary | ICD-10-CM | POA: Insufficient documentation

## 2017-12-29 DIAGNOSIS — I824Y9 Acute embolism and thrombosis of unspecified deep veins of unspecified proximal lower extremity: Secondary | ICD-10-CM

## 2017-12-29 DIAGNOSIS — D6861 Antiphospholipid syndrome: Secondary | ICD-10-CM

## 2017-12-29 DIAGNOSIS — F17210 Nicotine dependence, cigarettes, uncomplicated: Secondary | ICD-10-CM | POA: Diagnosis not present

## 2017-12-29 DIAGNOSIS — E611 Iron deficiency: Secondary | ICD-10-CM

## 2017-12-29 LAB — COMPREHENSIVE METABOLIC PANEL
ALBUMIN: 4.1 g/dL (ref 3.5–5.0)
ALK PHOS: 95 U/L (ref 38–126)
ALT: 25 U/L (ref 14–54)
AST: 31 U/L (ref 15–41)
Anion gap: 12 (ref 5–15)
BILIRUBIN TOTAL: 0.7 mg/dL (ref 0.3–1.2)
BUN: 26 mg/dL — AB (ref 6–20)
CALCIUM: 9.3 mg/dL (ref 8.9–10.3)
CO2: 23 mmol/L (ref 22–32)
CREATININE: 1.08 mg/dL — AB (ref 0.44–1.00)
Chloride: 97 mmol/L — ABNORMAL LOW (ref 101–111)
GFR calc Af Amer: >60 mL/min (ref >60)
GFR calc non Af Amer: 56 mL/min — ABNORMAL LOW (ref >60)
GLUCOSE: 85 mg/dL (ref 65–99)
Potassium: 3 mmol/L — ABNORMAL LOW (ref 3.5–5.1)
Sodium: 132 mmol/L — ABNORMAL LOW (ref 135–145)
TOTAL PROTEIN: 7.3 g/dL (ref 6.5–8.1)

## 2017-12-29 LAB — FERRITIN: Ferritin: 45 ng/mL (ref 11–307)

## 2017-12-29 LAB — CBC WITH DIFFERENTIAL/PLATELET
BASOS ABS: 0.1 10*3/uL (ref 0.0–0.1)
BASOS PCT: 1 %
Eosinophils Absolute: 0.2 10*3/uL (ref 0.0–0.7)
Eosinophils Relative: 3 %
HEMATOCRIT: 42.8 % (ref 36.0–46.0)
HEMOGLOBIN: 13.7 g/dL (ref 12.0–15.0)
LYMPHS PCT: 19 %
Lymphs Abs: 1.3 10*3/uL (ref 0.7–4.0)
MCH: 30.3 pg (ref 26.0–34.0)
MCHC: 32 g/dL (ref 30.0–36.0)
MCV: 94.7 fL (ref 78.0–100.0)
MONOS PCT: 12 %
Monocytes Absolute: 0.8 10*3/uL (ref 0.1–1.0)
NEUTROS ABS: 4.5 10*3/uL (ref 1.7–7.7)
NEUTROS PCT: 65 %
Platelets: 226 10*3/uL (ref 150–400)
RBC: 4.52 MIL/uL (ref 3.87–5.11)
RDW: 14.3 % (ref 11.5–15.5)
WBC: 7 10*3/uL (ref 4.0–10.5)

## 2017-12-29 LAB — IRON AND TIBC
Iron: 55 ug/dL (ref 28–170)
Saturation Ratios: 14 % (ref 10.4–31.8)
TIBC: 395 ug/dL (ref 250–450)
UIBC: 340 ug/dL

## 2017-12-29 LAB — PROTIME-INR
INR: 1.53
Prothrombin Time: 18.2 seconds — ABNORMAL HIGH (ref 11.4–15.2)

## 2017-12-29 MED ORDER — DENOSUMAB 60 MG/ML ~~LOC~~ SOLN
60.0000 mg | Freq: Once | SUBCUTANEOUS | Status: AC
  Administered 2017-12-29: 60 mg via SUBCUTANEOUS
  Filled 2017-12-29: qty 1

## 2017-12-29 NOTE — Progress Notes (Signed)
Samantha Fields presents today for injection per the provider's orders.  Prolia administration without incident; see MAR for injection details.  Patient tolerated procedure well and without incident.  No questions or complaints noted at this time.  Discharged ambulatory.

## 2018-01-05 ENCOUNTER — Other Ambulatory Visit (HOSPITAL_COMMUNITY): Payer: BLUE CROSS/BLUE SHIELD

## 2018-01-12 DIAGNOSIS — E876 Hypokalemia: Secondary | ICD-10-CM | POA: Insufficient documentation

## 2018-01-12 NOTE — Assessment & Plan Note (Signed)
Patient has history of osteoporosis, currently receiving supportive therapy with Prolia administered every 6 months.  I am not entirely sure why we, as hematology/oncology, are in charge of this problem in our clinic, but patient has been receiving Prolia from [place] so far.  Plan: -Proceed with Prolia injection today - DEXA scan prior to return to clinic - Return to clinic in 6 months for continued therapy unless more suitable location such as the patient's primary care provider or endocrinology can be arranged for.

## 2018-01-12 NOTE — Assessment & Plan Note (Signed)
Patient has history of iron deficiency that required parenteral iron supplementation in the past.  Currently, patient is not anemic with normal MCV, MCH, and MCHC.  Plan: - Continue maintenance iron supplementation with once daily pill.

## 2018-01-12 NOTE — Assessment & Plan Note (Signed)
57 y.o.   Female with history of antiphospholipid antibody syndrome, receiving therapeutic anticoagulation with warfarin.  INR today subtherapeutic at 1.53.  No symptoms to suggest recurrent thrombosis at this time.  Plan: -Increase warfarin dose to alternating 3.5mg /4mg  daily -Repeat INR weekly with further dose adjustments. -Consider referring patient for anticoagulation clinic for optimization of management

## 2018-01-12 NOTE — Assessment & Plan Note (Signed)
Lab work today demonstrates mild hypokalemia, stable since the previous visit in the clinic.  Plan: -Increase potassium chloride to 20 mEq 3 times a day -Repeat potassium level in 2 weeks to assess response to treatment.
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
## 2018-01-15 ENCOUNTER — Encounter: Payer: Self-pay | Admitting: Oncology

## 2018-01-15 DIAGNOSIS — I824Y9 Acute embolism and thrombosis of unspecified deep veins of unspecified proximal lower extremity: Secondary | ICD-10-CM | POA: Diagnosis not present

## 2018-01-15 LAB — PROTIME-INR: INR: 2 — AB (ref 0.9–1.1)

## 2018-01-17 DIAGNOSIS — M1712 Unilateral primary osteoarthritis, left knee: Secondary | ICD-10-CM | POA: Diagnosis not present

## 2018-01-18 ENCOUNTER — Encounter (HOSPITAL_COMMUNITY): Payer: Self-pay

## 2018-01-18 LAB — POCT INR: INR: 2

## 2018-01-21 DIAGNOSIS — J019 Acute sinusitis, unspecified: Secondary | ICD-10-CM | POA: Diagnosis not present

## 2018-01-30 ENCOUNTER — Other Ambulatory Visit: Payer: Self-pay | Admitting: Adult Health

## 2018-02-17 ENCOUNTER — Encounter (HOSPITAL_COMMUNITY): Payer: Self-pay

## 2018-02-18 ENCOUNTER — Encounter (HOSPITAL_COMMUNITY): Payer: Self-pay

## 2018-02-18 NOTE — Progress Notes (Signed)
Received authorization from [employer] to renew standing orders for patient. She requires a weekly PT/ INR because she is on coumadin. Reviewed with and signed by provider, Mike Craze, NP. These orders are good from 03/09/18 to 09/09/18.

## 2018-02-26 DIAGNOSIS — I824Y9 Acute embolism and thrombosis of unspecified deep veins of unspecified proximal lower extremity: Secondary | ICD-10-CM | POA: Diagnosis not present

## 2018-03-01 ENCOUNTER — Encounter (HOSPITAL_COMMUNITY): Payer: Self-pay

## 2018-03-01 LAB — POCT INR: INR: 2.5

## 2018-03-01 NOTE — Progress Notes (Signed)
Received PT/INR results from [facility] for patient. INR is 2.5. Per Mike Craze, NP, patient is to continue current coumadin dose, which is 3.5 mg daily, and repeat labs in 1 month. Notified patient with understanding verbalized.

## 2018-03-27 ENCOUNTER — Other Ambulatory Visit (HOSPITAL_COMMUNITY): Payer: Self-pay | Admitting: Adult Health

## 2018-03-27 DIAGNOSIS — E611 Iron deficiency: Secondary | ICD-10-CM

## 2018-05-03 ENCOUNTER — Other Ambulatory Visit: Payer: Self-pay | Admitting: Adult Health

## 2018-05-03 DIAGNOSIS — Z1231 Encounter for screening mammogram for malignant neoplasm of breast: Secondary | ICD-10-CM

## 2018-05-10 DIAGNOSIS — Z6829 Body mass index (BMI) 29.0-29.9, adult: Secondary | ICD-10-CM | POA: Diagnosis not present

## 2018-05-10 DIAGNOSIS — J019 Acute sinusitis, unspecified: Secondary | ICD-10-CM | POA: Diagnosis not present

## 2018-05-16 ENCOUNTER — Ambulatory Visit (HOSPITAL_COMMUNITY)
Admission: RE | Admit: 2018-05-16 | Discharge: 2018-05-16 | Disposition: A | Discharge status: Home / Self Care | Payer: BLUE CROSS/BLUE SHIELD | Source: Ambulatory Visit | Attending: Adult Health | Admitting: Adult Health

## 2018-05-16 ENCOUNTER — Other Ambulatory Visit: Payer: Self-pay | Admitting: Adult Health

## 2018-05-16 DIAGNOSIS — Z1231 Encounter for screening mammogram for malignant neoplasm of breast: Secondary | ICD-10-CM

## 2018-05-18 ENCOUNTER — Other Ambulatory Visit: Payer: Self-pay | Admitting: Adult Health

## 2018-05-18 DIAGNOSIS — Z1231 Encounter for screening mammogram for malignant neoplasm of breast: Secondary | ICD-10-CM

## 2018-06-07 ENCOUNTER — Telehealth (HOSPITAL_COMMUNITY): Payer: Self-pay | Admitting: *Deleted

## 2018-06-07 NOTE — Telephone Encounter (Signed)
Message left on patient's vm regarding a referral to the Coumadin Clinic for INR/Coumadin management. Awaiting a phone call back from patient to see if she wants to have her PCP manage or the Coumadin Clinic.

## 2018-06-10 ENCOUNTER — Telehealth (HOSPITAL_COMMUNITY): Payer: Self-pay | Admitting: *Deleted

## 2018-06-10 ENCOUNTER — Telehealth: Payer: Self-pay | Admitting: General Practice

## 2018-06-10 NOTE — Telephone Encounter (Signed)
LMOVM.  Patient will need to follow up with PCP for coumadin management.  Patient's must have primary care with [facility] to be managed at our coumadin clinic.

## 2018-06-13 ENCOUNTER — Telehealth: Payer: Self-pay | Admitting: Obstetrics & Gynecology

## 2018-06-13 NOTE — Telephone Encounter (Signed)
Left message, hope she gets good news on MRI

## 2018-06-14 ENCOUNTER — Other Ambulatory Visit: Payer: BLUE CROSS/BLUE SHIELD | Admitting: Adult Health

## 2018-06-20 ENCOUNTER — Other Ambulatory Visit (HOSPITAL_COMMUNITY): Payer: BLUE CROSS/BLUE SHIELD

## 2018-06-23 ENCOUNTER — Other Ambulatory Visit (HOSPITAL_COMMUNITY): Payer: Self-pay | Admitting: *Deleted

## 2018-06-23 DIAGNOSIS — M810 Age-related osteoporosis without current pathological fracture: Secondary | ICD-10-CM

## 2018-06-23 DIAGNOSIS — I824Y9 Acute embolism and thrombosis of unspecified deep veins of unspecified proximal lower extremity: Secondary | ICD-10-CM

## 2018-06-27 ENCOUNTER — Other Ambulatory Visit (HOSPITAL_COMMUNITY): Payer: BLUE CROSS/BLUE SHIELD

## 2018-06-29 ENCOUNTER — Other Ambulatory Visit (HOSPITAL_COMMUNITY): Payer: BLUE CROSS/BLUE SHIELD

## 2018-06-29 ENCOUNTER — Ambulatory Visit (HOSPITAL_COMMUNITY): Payer: BLUE CROSS/BLUE SHIELD

## 2018-06-29 ENCOUNTER — Ambulatory Visit (HOSPITAL_COMMUNITY): Payer: BLUE CROSS/BLUE SHIELD | Admitting: Internal Medicine

## 2018-07-08 DIAGNOSIS — Z6829 Body mass index (BMI) 29.0-29.9, adult: Secondary | ICD-10-CM | POA: Diagnosis not present

## 2018-07-08 DIAGNOSIS — Z5181 Encounter for therapeutic drug level monitoring: Secondary | ICD-10-CM | POA: Diagnosis not present

## 2018-07-08 DIAGNOSIS — J019 Acute sinusitis, unspecified: Secondary | ICD-10-CM | POA: Diagnosis not present

## 2018-07-13 ENCOUNTER — Ambulatory Visit (HOSPITAL_COMMUNITY)
Admission: RE | Admit: 2018-07-13 | Discharge: 2018-07-13 | Disposition: A | Discharge status: Home / Self Care | Payer: BLUE CROSS/BLUE SHIELD | Source: Ambulatory Visit | Attending: Hematology and Oncology | Admitting: Hematology and Oncology

## 2018-07-13 ENCOUNTER — Inpatient Hospital Stay (HOSPITAL_COMMUNITY): Payer: BLUE CROSS/BLUE SHIELD | Attending: Internal Medicine

## 2018-07-13 DIAGNOSIS — Z86718 Personal history of other venous thrombosis and embolism: Secondary | ICD-10-CM | POA: Diagnosis not present

## 2018-07-13 DIAGNOSIS — E782 Mixed hyperlipidemia: Secondary | ICD-10-CM | POA: Diagnosis not present

## 2018-07-13 DIAGNOSIS — Z780 Asymptomatic menopausal state: Secondary | ICD-10-CM | POA: Diagnosis not present

## 2018-07-13 DIAGNOSIS — E876 Hypokalemia: Secondary | ICD-10-CM | POA: Diagnosis not present

## 2018-07-13 DIAGNOSIS — D6861 Antiphospholipid syndrome: Secondary | ICD-10-CM | POA: Diagnosis not present

## 2018-07-13 DIAGNOSIS — M810 Age-related osteoporosis without current pathological fracture: Secondary | ICD-10-CM

## 2018-07-13 DIAGNOSIS — I824Y9 Acute embolism and thrombosis of unspecified deep veins of unspecified proximal lower extremity: Secondary | ICD-10-CM

## 2018-07-13 DIAGNOSIS — Z0001 Encounter for general adult medical examination with abnormal findings: Secondary | ICD-10-CM | POA: Diagnosis not present

## 2018-07-13 DIAGNOSIS — R944 Abnormal results of kidney function studies: Secondary | ICD-10-CM | POA: Diagnosis not present

## 2018-07-13 DIAGNOSIS — Z79899 Other long term (current) drug therapy: Secondary | ICD-10-CM | POA: Insufficient documentation

## 2018-07-13 LAB — COMPREHENSIVE METABOLIC PANEL
ALK PHOS: 93 U/L (ref 38–126)
ALT: 34 U/L (ref 0–44)
AST: 26 U/L (ref 15–41)
Albumin: 4.2 g/dL (ref 3.5–5.0)
Anion gap: 10 (ref 5–15)
BILIRUBIN TOTAL: 0.8 mg/dL (ref 0.3–1.2)
BUN: 26 mg/dL — AB (ref 6–20)
CHLORIDE: 99 mmol/L (ref 98–111)
CO2: 28 mmol/L (ref 22–32)
CREATININE: 1.12 mg/dL — AB (ref 0.44–1.00)
Calcium: 9.6 mg/dL (ref 8.9–10.3)
GFR, EST NON AFRICAN AMERICAN: 53 mL/min — AB (ref >60)
Glucose, Bld: 90 mg/dL (ref 70–99)
Potassium: 3 mmol/L — ABNORMAL LOW (ref 3.5–5.1)
Sodium: 137 mmol/L (ref 135–145)
Total Protein: 7.6 g/dL (ref 6.5–8.1)

## 2018-07-13 LAB — PROTIME-INR
INR: 2.61
PROTHROMBIN TIME: 27.7 s — AB (ref 11.4–15.2)

## 2018-07-15 ENCOUNTER — Ambulatory Visit (HOSPITAL_COMMUNITY): Payer: BLUE CROSS/BLUE SHIELD

## 2018-07-15 ENCOUNTER — Telehealth (HOSPITAL_COMMUNITY): Payer: Self-pay | Admitting: *Deleted

## 2018-07-15 ENCOUNTER — Ambulatory Visit (HOSPITAL_COMMUNITY): Payer: BLUE CROSS/BLUE SHIELD | Admitting: Internal Medicine

## 2018-07-15 NOTE — Telephone Encounter (Signed)
I attempted to reach patient by cell phone. I left a message letting her know that she needed to keep coumadin @ current dosage. INR 2.61. Next INR level to be drawn and followed by Coumadin Clinic starting 07/28/18.

## 2018-07-19 ENCOUNTER — Encounter (HOSPITAL_COMMUNITY): Payer: Self-pay | Admitting: Internal Medicine

## 2018-07-19 ENCOUNTER — Inpatient Hospital Stay (HOSPITAL_COMMUNITY): Payer: BLUE CROSS/BLUE SHIELD

## 2018-07-19 ENCOUNTER — Inpatient Hospital Stay (HOSPITAL_COMMUNITY): Payer: BLUE CROSS/BLUE SHIELD | Attending: Internal Medicine | Admitting: Internal Medicine

## 2018-07-19 VITALS — BP 118/73 | HR 85 | Temp 98.4°F | Resp 14 | Wt 184.1 lb

## 2018-07-19 DIAGNOSIS — D6861 Antiphospholipid syndrome: Secondary | ICD-10-CM

## 2018-07-19 DIAGNOSIS — E876 Hypokalemia: Secondary | ICD-10-CM | POA: Diagnosis not present

## 2018-07-19 DIAGNOSIS — Z86718 Personal history of other venous thrombosis and embolism: Secondary | ICD-10-CM | POA: Diagnosis not present

## 2018-07-19 DIAGNOSIS — M25511 Pain in right shoulder: Secondary | ICD-10-CM | POA: Diagnosis not present

## 2018-07-19 DIAGNOSIS — M810 Age-related osteoporosis without current pathological fracture: Secondary | ICD-10-CM

## 2018-07-19 DIAGNOSIS — E611 Iron deficiency: Secondary | ICD-10-CM

## 2018-07-19 DIAGNOSIS — Z862 Personal history of diseases of the blood and blood-forming organs and certain disorders involving the immune mechanism: Secondary | ICD-10-CM | POA: Diagnosis not present

## 2018-07-19 DIAGNOSIS — Z7901 Long term (current) use of anticoagulants: Secondary | ICD-10-CM

## 2018-07-19 MED ORDER — DENOSUMAB 60 MG/ML ~~LOC~~ SOSY
60.0000 mg | PREFILLED_SYRINGE | Freq: Once | SUBCUTANEOUS | Status: AC
  Administered 2018-07-19: 60 mg via SUBCUTANEOUS
  Filled 2018-07-19: qty 1

## 2018-07-19 NOTE — Progress Notes (Signed)
Patient to go to PCP for coumadin monitoring via a home monitoring system. I called and cancelled the cardiology appt (coumadin clinic) for the middle of August.

## 2018-07-19 NOTE — Patient Instructions (Signed)
[place] at [facility]  Discharge Instructions:  You received Prolia injection today. Follow up as scheduled. Call clinic for any questions or concerns.  _______________________________________________________________  Thank you for choosing [place] at [facility] to provide your oncology and hematology care.  To afford each patient quality time with our providers, please arrive at least 15 minutes before your scheduled appointment.  You need to re-schedule your appointment if you arrive 10 or more minutes late.  We strive to give you quality time with our providers, and arriving late affects you and other patients whose appointments are after yours.  Also, if you no show three or more times for appointments you may be dismissed from the clinic.  Again, thank you for choosing [place] at [facility] hope is that these requests will allow you access to exceptional care and in a timely manner. _______________________________________________________________  If you have questions after your visit, please contact our office at [phone] between the hours of 8:30 a.m. and 5:00 p.m. Voicemails left after 4:30 p.m. will not be returned until the following business day. _______________________________________________________________  For prescription refill requests, have your pharmacy contact our office. _______________________________________________________________  Recommendations made by the consultant and any test results will be sent to your referring physician. _______________________________________________________________
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
## 2018-07-19 NOTE — Patient Instructions (Signed)
[facility] at [facility] Discharge Instructions  You saw Dr. Higgs today.   Thank you for choosing [facility] at [facility] to provide your oncology and hematology care.  To afford each patient quality time with our provider, please arrive at least 15 minutes before your scheduled appointment time.   If you have a lab appointment with the Cancer Center please come in thru the  Main Entrance and check in at the main information desk  You need to re-schedule your appointment should you arrive 10 or more minutes late.  We strive to give you quality time with our providers, and arriving late affects you and other patients whose appointments are after yours.  Also, if you no show three or more times for appointments you may be dismissed from the clinic at the providers discretion.     Again, thank you for choosing [facility].  Our hope is that these requests will decrease the amount of time that you wait before being seen by our physicians.       _____________________________________________________________  Should you have questions after your visit to [facility], please contact our office at [phone] between the hours of 8:00 a.m. and 4:30 p.m.  Voicemails left after 4:00 p.m. will not be returned until the following business day.  For prescription refill requests, have your pharmacy contact our office and allow 72 hours.    Cancer Center Support Programs:   > Cancer Support Group  2nd Tuesday of the month 1pm-2pm, Journey Room    

## 2018-07-19 NOTE — Progress Notes (Signed)
Quintella E Yohn tolerated Prolia injection well without incident or complaint. Denies tooth or jaw pain and no planned dentist work upcoming. Taking Calcium as previously instructed. Labs reviewed nad patient seen by Dr. Walden Field, Ca 9.6. Patient discharged self ambulatory in satisfactory condition.

## 2018-07-28 ENCOUNTER — Ambulatory Visit: Payer: BLUE CROSS/BLUE SHIELD | Admitting: Cardiology

## 2018-08-16 ENCOUNTER — Ambulatory Visit (INDEPENDENT_AMBULATORY_CARE_PROVIDER_SITE_OTHER): Payer: BLUE CROSS/BLUE SHIELD | Admitting: Adult Health

## 2018-08-16 ENCOUNTER — Encounter: Payer: Self-pay | Admitting: Adult Health

## 2018-08-16 ENCOUNTER — Other Ambulatory Visit: Payer: Self-pay

## 2018-08-16 ENCOUNTER — Other Ambulatory Visit (HOSPITAL_COMMUNITY): Payer: Self-pay | Admitting: *Deleted

## 2018-08-16 VITALS — BP 110/69 | HR 75 | Resp 16 | Ht 64.5 in | Wt 180.0 lb

## 2018-08-16 DIAGNOSIS — Z1212 Encounter for screening for malignant neoplasm of rectum: Secondary | ICD-10-CM | POA: Diagnosis not present

## 2018-08-16 DIAGNOSIS — R070 Pain in throat: Secondary | ICD-10-CM | POA: Diagnosis not present

## 2018-08-16 DIAGNOSIS — D6861 Antiphospholipid syndrome: Secondary | ICD-10-CM

## 2018-08-16 DIAGNOSIS — Z1211 Encounter for screening for malignant neoplasm of colon: Secondary | ICD-10-CM | POA: Diagnosis not present

## 2018-08-16 DIAGNOSIS — F419 Anxiety disorder, unspecified: Secondary | ICD-10-CM

## 2018-08-16 DIAGNOSIS — Z7901 Long term (current) use of anticoagulants: Secondary | ICD-10-CM | POA: Diagnosis not present

## 2018-08-16 DIAGNOSIS — Z01419 Encounter for gynecological examination (general) (routine) without abnormal findings: Secondary | ICD-10-CM | POA: Diagnosis not present

## 2018-08-16 LAB — HEMOCCULT GUIAC POC 1CARD (OFFICE): Fecal Occult Blood, POC: NEGATIVE

## 2018-08-16 MED ORDER — WARFARIN SODIUM 3 MG PO TABS: ORAL_TABLET | ORAL | 2 refills | Status: DC

## 2018-08-16 MED ORDER — WARFARIN SODIUM 1 MG PO TABS: 1.0000 mg | ORAL_TABLET | Freq: Every day | ORAL | 2 refills | Status: DC

## 2018-08-16 NOTE — Progress Notes (Signed)
Patient ID: Samantha Fields, female   DOB: 1961-03-02, 57 y.o.   MRN: [id number] History of Present Illness:  Samantha Fields is a 57 year old white female, married in for a well woman gyn exam, she is sp hysterectomy. PCP is Dr Nevada Crane.  Current Medications, Allergies, Past Medical History, Past Surgical History, Family History and Social History were reviewed in [employer] record.     Review of Systems: Patient denies any headaches, hearing loss, fatigue, blurred vision, shortness of breath, chest pain, abdominal pain, problems with bowel movements, urination, or intercourse. No joint pain or mood swings.    Physical Exam:BP 110/69 (BP Location: Right Arm, Patient Position: Sitting, Cuff Size: Normal)   Pulse 75   Resp 16   Ht 5' 4.5" (1.638 m)   Wt 180 lb (81.6 kg)   BMI 30.42 kg/m  General:  Well developed, well nourished, no acute distress Skin:  Warm and dry Neck:  Midline trachea, normal thyroid, good ROM, no lymphadenopathy Lungs; Clear to auscultation bilaterally Breast:  No dominant palpable mass, retraction, or nipple discharge Cardiovascular: Regular rate and rhythm Abdomen:  Soft, non tender, no hepatosplenomegaly Pelvic:  External genitalia is normal in appearance, no lesions.  The vagina is normal in appearance. Urethra has no lesions or masses. The cervix and uterus are absent.  No adnexal masses or tenderness noted.Bladder is non tender, no masses felt. Rectal: Good sphincter tone, no polyps, or hemorrhoids felt.  Hemoccult negative. Extremities/musculoskeletal:  No swelling or varicosities noted, no clubbing or cyanosis Psych:  No mood changes, alert and cooperative,seems happy PHQ 2 score 0.  Impression:  1. Well woman exam with routine gynecological exam   2. Screening for colorectal cancer   3. Anxiety      Plan: Physical in 1 year Mammogram year;y Labs with PCP Continue celexa has refills

## 2018-08-30 ENCOUNTER — Telehealth (HOSPITAL_COMMUNITY): Payer: Self-pay | Admitting: *Deleted

## 2018-08-30 NOTE — Telephone Encounter (Signed)
Patient is using home monitoring INR system and is no longer getting INR checks with f/u to INRs @ [facility]. Patient will continue her visits here but will not be followed for Coumadin/INRs.

## 2018-09-01 ENCOUNTER — Encounter: Payer: Self-pay | Admitting: Neurology

## 2018-09-01 ENCOUNTER — Ambulatory Visit (INDEPENDENT_AMBULATORY_CARE_PROVIDER_SITE_OTHER): Payer: BLUE CROSS/BLUE SHIELD | Admitting: Neurology

## 2018-09-01 ENCOUNTER — Other Ambulatory Visit: Payer: Self-pay

## 2018-09-01 VITALS — BP 104/71 | HR 68 | Resp 18 | Ht 64.5 in | Wt 182.0 lb

## 2018-09-01 DIAGNOSIS — I679 Cerebrovascular disease, unspecified: Secondary | ICD-10-CM

## 2018-09-01 DIAGNOSIS — R569 Unspecified convulsions: Secondary | ICD-10-CM | POA: Diagnosis not present

## 2018-09-01 DIAGNOSIS — G3184 Mild cognitive impairment, so stated: Secondary | ICD-10-CM | POA: Diagnosis not present

## 2018-09-01 MED ORDER — TOPIRAMATE ER 100 MG PO CAP24: 2.0000 | ORAL_CAPSULE | Freq: Every day | ORAL | 4 refills | Status: DC
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
## 2018-11-03 DIAGNOSIS — Z7901 Long term (current) use of anticoagulants: Secondary | ICD-10-CM | POA: Diagnosis not present

## 2018-11-03 DIAGNOSIS — D6861 Antiphospholipid syndrome: Secondary | ICD-10-CM | POA: Diagnosis not present

## 2018-11-03 DIAGNOSIS — I82509 Chronic embolism and thrombosis of unspecified deep veins of unspecified lower extremity: Secondary | ICD-10-CM | POA: Diagnosis not present

## 2018-11-08 ENCOUNTER — Other Ambulatory Visit (HOSPITAL_COMMUNITY): Payer: Self-pay | Admitting: *Deleted

## 2018-11-08 DIAGNOSIS — E611 Iron deficiency: Secondary | ICD-10-CM

## 2018-11-08 MED ORDER — POLYSACCHARIDE IRON COMPLEX 150 MG PO CAPS: 150.0000 mg | ORAL_CAPSULE | Freq: Every day | ORAL | 5 refills | Status: DC

## 2018-11-09 DIAGNOSIS — Z7901 Long term (current) use of anticoagulants: Secondary | ICD-10-CM | POA: Diagnosis not present

## 2018-11-09 DIAGNOSIS — D6861 Antiphospholipid syndrome: Secondary | ICD-10-CM | POA: Diagnosis not present

## 2018-11-09 DIAGNOSIS — I82509 Chronic embolism and thrombosis of unspecified deep veins of unspecified lower extremity: Secondary | ICD-10-CM | POA: Diagnosis not present

## 2018-11-24 DIAGNOSIS — Z7901 Long term (current) use of anticoagulants: Secondary | ICD-10-CM | POA: Diagnosis not present

## 2018-11-24 DIAGNOSIS — D6861 Antiphospholipid syndrome: Secondary | ICD-10-CM | POA: Diagnosis not present

## 2018-12-03 DIAGNOSIS — I82509 Chronic embolism and thrombosis of unspecified deep veins of unspecified lower extremity: Secondary | ICD-10-CM | POA: Diagnosis not present

## 2018-12-03 DIAGNOSIS — D6861 Antiphospholipid syndrome: Secondary | ICD-10-CM | POA: Diagnosis not present

## 2018-12-03 DIAGNOSIS — Z7901 Long term (current) use of anticoagulants: Secondary | ICD-10-CM | POA: Diagnosis not present

## 2018-12-31 ENCOUNTER — Other Ambulatory Visit: Payer: Self-pay | Admitting: Adult Health

## 2019-01-02 DIAGNOSIS — M542 Cervicalgia: Secondary | ICD-10-CM | POA: Diagnosis not present

## 2019-01-02 DIAGNOSIS — D689 Coagulation defect, unspecified: Secondary | ICD-10-CM | POA: Diagnosis not present

## 2019-01-02 DIAGNOSIS — M810 Age-related osteoporosis without current pathological fracture: Secondary | ICD-10-CM | POA: Diagnosis not present

## 2019-01-17 ENCOUNTER — Inpatient Hospital Stay (HOSPITAL_COMMUNITY): Payer: BLUE CROSS/BLUE SHIELD | Attending: Hematology

## 2019-01-17 ENCOUNTER — Ambulatory Visit (HOSPITAL_COMMUNITY): Payer: BLUE CROSS/BLUE SHIELD | Admitting: Hematology

## 2019-01-17 DIAGNOSIS — D6861 Antiphospholipid syndrome: Secondary | ICD-10-CM | POA: Diagnosis not present

## 2019-01-17 DIAGNOSIS — E876 Hypokalemia: Secondary | ICD-10-CM | POA: Diagnosis not present

## 2019-01-17 DIAGNOSIS — E611 Iron deficiency: Secondary | ICD-10-CM

## 2019-01-17 LAB — CBC WITH DIFFERENTIAL/PLATELET
ABS IMMATURE GRANULOCYTES: 0.04 10*3/uL (ref 0.00–0.07)
BASOS ABS: 0.1 10*3/uL (ref 0.0–0.1)
Basophils Relative: 1 %
Eosinophils Absolute: 0.2 10*3/uL (ref 0.0–0.5)
Eosinophils Relative: 2 %
HCT: 46.3 % — ABNORMAL HIGH (ref 36.0–46.0)
Hemoglobin: 15.1 g/dL — ABNORMAL HIGH (ref 12.0–15.0)
IMMATURE GRANULOCYTES: 0 %
LYMPHS PCT: 12 %
Lymphs Abs: 1.1 10*3/uL (ref 0.7–4.0)
MCH: 30.4 pg (ref 26.0–34.0)
MCHC: 32.6 g/dL (ref 30.0–36.0)
MCV: 93.2 fL (ref 80.0–100.0)
Monocytes Absolute: 0.9 10*3/uL (ref 0.1–1.0)
Monocytes Relative: 9 %
NEUTROS ABS: 7.1 10*3/uL (ref 1.7–7.7)
Neutrophils Relative %: 76 %
PLATELETS: 248 10*3/uL (ref 150–400)
RBC: 4.97 MIL/uL (ref 3.87–5.11)
RDW: 13.5 % (ref 11.5–15.5)
WBC: 9.3 10*3/uL (ref 4.0–10.5)
nRBC: 0 % (ref 0.0–0.2)

## 2019-01-17 LAB — COMPREHENSIVE METABOLIC PANEL
ALT: 22 U/L (ref 0–44)
AST: 27 U/L (ref 15–41)
Albumin: 4.2 g/dL (ref 3.5–5.0)
Alkaline Phosphatase: 87 U/L (ref 38–126)
Anion gap: 11 (ref 5–15)
BUN: 24 mg/dL — AB (ref 6–20)
CO2: 29 mmol/L (ref 22–32)
CREATININE: 1.12 mg/dL — AB (ref 0.44–1.00)
Calcium: 9.4 mg/dL (ref 8.9–10.3)
Chloride: 94 mmol/L — ABNORMAL LOW (ref 98–111)
GFR calc non Af Amer: 54 mL/min — ABNORMAL LOW (ref >60)
Glucose, Bld: 99 mg/dL (ref 70–99)
Potassium: 2.2 mmol/L — CL (ref 3.5–5.1)
SODIUM: 134 mmol/L — AB (ref 135–145)
Total Bilirubin: 0.6 mg/dL (ref 0.3–1.2)
Total Protein: 7.3 g/dL (ref 6.5–8.1)

## 2019-01-17 LAB — FERRITIN: FERRITIN: 27 ng/mL (ref 11–307)

## 2019-01-17 LAB — LACTATE DEHYDROGENASE: LDH: 153 U/L (ref 98–192)

## 2019-01-17 MED ORDER — POTASSIUM CHLORIDE CRYS ER 20 MEQ PO TBCR: 40.0000 meq | EXTENDED_RELEASE_TABLET | Freq: Every day | ORAL | 0 refills | Status: DC

## 2019-01-17 NOTE — Progress Notes (Signed)
CRITICAL VALUE ALERT  Critical Value:  K+ 2.2  Date & Time Notied:  01/17/2019 at 1600   Provider Notified: Dr. Walden Field  Orders Received/Actions taken: Orders rec'd to send Rx K-Dur 40 mEq daily x 1 week supply to pt's pharmacy, and to advise pt to f/u with her PCP ASAP for management of hypokalemia.  Pt contacted and notified of her lab results. Instructed to schedule appt with PCP ASAP.  Rx sent to [facility] - pt aware and states she will pick up Rx and take as instructed.  Also states she will call her PCP tomorrow to schedule appt for management of her hypokalemia. We will recheck her serum K+ next week as she is scheduled for OV and will be in clinic.

## 2019-01-24 ENCOUNTER — Encounter (HOSPITAL_COMMUNITY): Payer: Self-pay | Admitting: Emergency Medicine

## 2019-01-24 ENCOUNTER — Inpatient Hospital Stay (HOSPITAL_COMMUNITY): Payer: BLUE CROSS/BLUE SHIELD

## 2019-01-24 ENCOUNTER — Other Ambulatory Visit: Payer: Self-pay

## 2019-01-24 ENCOUNTER — Inpatient Hospital Stay (HOSPITAL_COMMUNITY): Payer: BC Managed Care – PPO | Attending: Hematology

## 2019-01-24 ENCOUNTER — Inpatient Hospital Stay (HOSPITAL_COMMUNITY): Payer: BLUE CROSS/BLUE SHIELD | Attending: Hematology | Admitting: Hematology

## 2019-01-24 ENCOUNTER — Encounter (HOSPITAL_COMMUNITY): Payer: Self-pay | Admitting: Hematology

## 2019-01-24 VITALS — BP 89/68 | HR 85 | Temp 97.6°F | Resp 16 | Wt 179.3 lb

## 2019-01-24 DIAGNOSIS — F17210 Nicotine dependence, cigarettes, uncomplicated: Secondary | ICD-10-CM

## 2019-01-24 DIAGNOSIS — E876 Hypokalemia: Secondary | ICD-10-CM | POA: Insufficient documentation

## 2019-01-24 DIAGNOSIS — Z79899 Other long term (current) drug therapy: Secondary | ICD-10-CM | POA: Insufficient documentation

## 2019-01-24 DIAGNOSIS — D6861 Antiphospholipid syndrome: Secondary | ICD-10-CM | POA: Diagnosis not present

## 2019-01-24 DIAGNOSIS — M810 Age-related osteoporosis without current pathological fracture: Secondary | ICD-10-CM

## 2019-01-24 DIAGNOSIS — Z7901 Long term (current) use of anticoagulants: Secondary | ICD-10-CM | POA: Diagnosis not present

## 2019-01-24 DIAGNOSIS — R42 Dizziness and giddiness: Secondary | ICD-10-CM | POA: Insufficient documentation

## 2019-01-24 DIAGNOSIS — I959 Hypotension, unspecified: Secondary | ICD-10-CM | POA: Insufficient documentation

## 2019-01-24 DIAGNOSIS — R531 Weakness: Secondary | ICD-10-CM | POA: Diagnosis not present

## 2019-01-24 LAB — POTASSIUM: Potassium: 2.6 mmol/L — CL (ref 3.5–5.1)

## 2019-01-24 MED ORDER — POTASSIUM CHLORIDE CRYS ER 20 MEQ PO TBCR: EXTENDED_RELEASE_TABLET | ORAL | 2 refills | Status: DC

## 2019-01-24 MED ORDER — DENOSUMAB 60 MG/ML ~~LOC~~ SOSY
60.0000 mg | PREFILLED_SYRINGE | Freq: Once | SUBCUTANEOUS | Status: AC
  Administered 2019-01-24: 60 mg via SUBCUTANEOUS
  Filled 2019-01-24: qty 1

## 2019-01-24 NOTE — Progress Notes (Signed)
Samantha Fields tolerated Prolia injection well without complaints or incident. Calcium 9.4 today and pt denied any tooth or jaw pain and no recent or future dental visits. Pt continues to take her Calcium PO as prescribed without any issues. VSS Pt discharged self ambulatory in satisfactory condition accompanied by family members

## 2019-01-24 NOTE — Patient Instructions (Signed)
[facility] at [facility] Discharge Instructions     Thank you for choosing  Cancer Center at [facility] to provide your oncology and hematology care.  To afford each patient quality time with our provider, please arrive at least 15 minutes before your scheduled appointment time.   If you have a lab appointment with the Cancer Center please come in thru the  Main Entrance and check in at the main information desk  You need to re-schedule your appointment should you arrive 10 or more minutes late.  We strive to give you quality time with our providers, and arriving late affects you and other patients whose appointments are after yours.  Also, if you no show three or more times for appointments you may be dismissed from the clinic at the providers discretion.     Again, thank you for choosing [facility].  Our hope is that these requests will decrease the amount of time that you wait before being seen by our physicians.       _____________________________________________________________  Should you have questions after your visit to [facility], please contact our office at [phone] between the hours of 8:00 a.m. and 4:30 p.m.  Voicemails left after 4:00 p.m. will not be returned until the following business day.  For prescription refill requests, have your pharmacy contact our office and allow 72 hours.    Cancer Center Support Programs:   > Cancer Support Group  2nd Tuesday of the month 1pm-2pm, Journey Room    
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
## 2019-01-24 NOTE — Progress Notes (Signed)
Critical result notification. Potassium 2.6. results given to Lala Lund, NP.

## 2019-01-24 NOTE — Patient Instructions (Signed)
[facility] at [facility] Discharge Instructions  Received Prolia injection today. Follow-up as scheduled. Call clinic for any questions or concerns   Thank you for choosing  Cancer Center at [facility] to provide your oncology and hematology care.  To afford each patient quality time with our provider, please arrive at least 15 minutes before your scheduled appointment time.   If you have a lab appointment with the Cancer Center please come in thru the  Main Entrance and check in at the main information desk  You need to re-schedule your appointment should you arrive 10 or more minutes late.  We strive to give you quality time with our providers, and arriving late affects you and other patients whose appointments are after yours.  Also, if you no show three or more times for appointments you may be dismissed from the clinic at the providers discretion.     Again, thank you for choosing [facility].  Our hope is that these requests will decrease the amount of time that you wait before being seen by our physicians.       _____________________________________________________________  Should you have questions after your visit to [facility], please contact our office at [phone] between the hours of 8:00 a.m. and 4:30 p.m.  Voicemails left after 4:00 p.m. will not be returned until the following business day.  For prescription refill requests, have your pharmacy contact our office and allow 72 hours.    Cancer Center Support Programs:   > Cancer Support Group  2nd Tuesday of the month 1pm-2pm, Journey Room   

## 2019-01-24 NOTE — Assessment & Plan Note (Addendum)
1.  Antiphospholipid syndrome: - On lifelong anticoagulation with Coumadin. -She checks her Coumadin levels at home. -She is currently bridging with Lovenox as she had to stop Coumadin for a procedure. -No recent blood clots reported. -She was slightly hypotensive with a blood pressure of systolic 89.  However we rechecked it at and improved to 101.  2.  Osteoporosis: -She was started on Prolia on 06/18/2016. -Recent DEXA scan on 07/13/2018 shows T score of -3.0, significant increase from prior. -I have reviewed her calcium level today.  3.  Hypokalemia: - Her potassium was found to be severely low at 2.2 about 1 week ago. -She was given 40 mEq potassium daily.  Repeat potassium today was 2.6. -I will increase her potassium to 40 mEq in the morning and 20 mEq in the evening.  We will recheck her potassium in 10 days. -She was told to hold HCTZ until further notice.  4.  Iron deficiency state: -She had a history of iron deficiency.  She received Feraheme infusion on 12/09/2016. -She is taking Ferrex 150 mg daily.  Her ferritin is down to 26.  However her hemoglobin is 15.1.  5.  Health maintenance: -Her last mammogram on 05/16/2018 was BI-RADS Category 1.

## 2019-02-07 ENCOUNTER — Inpatient Hospital Stay (HOSPITAL_COMMUNITY): Payer: BLUE CROSS/BLUE SHIELD

## 2019-02-07 DIAGNOSIS — E876 Hypokalemia: Secondary | ICD-10-CM

## 2019-02-07 DIAGNOSIS — E611 Iron deficiency: Secondary | ICD-10-CM | POA: Diagnosis not present

## 2019-02-07 DIAGNOSIS — D6861 Antiphospholipid syndrome: Secondary | ICD-10-CM | POA: Diagnosis not present

## 2019-02-07 LAB — POTASSIUM: Potassium: 3.8 mmol/L (ref 3.5–5.1)

## 2019-05-12 ENCOUNTER — Other Ambulatory Visit: Payer: Self-pay | Admitting: Adult Health

## 2019-05-26 DIAGNOSIS — E782 Mixed hyperlipidemia: Secondary | ICD-10-CM | POA: Diagnosis not present

## 2019-05-26 DIAGNOSIS — E876 Hypokalemia: Secondary | ICD-10-CM | POA: Diagnosis not present

## 2019-05-26 DIAGNOSIS — R944 Abnormal results of kidney function studies: Secondary | ICD-10-CM | POA: Diagnosis not present

## 2019-06-02 DIAGNOSIS — E876 Hypokalemia: Secondary | ICD-10-CM | POA: Diagnosis not present

## 2019-06-02 DIAGNOSIS — N183 Chronic kidney disease, stage 3 (moderate): Secondary | ICD-10-CM | POA: Diagnosis not present

## 2019-06-02 DIAGNOSIS — D6861 Antiphospholipid syndrome: Secondary | ICD-10-CM | POA: Diagnosis not present

## 2019-06-02 DIAGNOSIS — E782 Mixed hyperlipidemia: Secondary | ICD-10-CM | POA: Diagnosis not present

## 2019-06-10 ENCOUNTER — Other Ambulatory Visit (HOSPITAL_COMMUNITY): Payer: Self-pay | Admitting: Nurse Practitioner

## 2019-06-10 DIAGNOSIS — D6861 Antiphospholipid syndrome: Secondary | ICD-10-CM

## 2019-06-12 ENCOUNTER — Other Ambulatory Visit (HOSPITAL_COMMUNITY): Payer: Self-pay | Admitting: Nurse Practitioner

## 2019-06-12 DIAGNOSIS — D6861 Antiphospholipid syndrome: Secondary | ICD-10-CM

## 2019-06-15 DIAGNOSIS — Z0001 Encounter for general adult medical examination with abnormal findings: Secondary | ICD-10-CM | POA: Diagnosis not present

## 2019-06-15 DIAGNOSIS — D6861 Antiphospholipid syndrome: Secondary | ICD-10-CM | POA: Diagnosis not present

## 2019-06-15 DIAGNOSIS — E876 Hypokalemia: Secondary | ICD-10-CM | POA: Diagnosis not present

## 2019-06-15 DIAGNOSIS — E782 Mixed hyperlipidemia: Secondary | ICD-10-CM | POA: Diagnosis not present

## 2019-06-21 ENCOUNTER — Other Ambulatory Visit (HOSPITAL_COMMUNITY): Payer: Self-pay | Admitting: Adult Health

## 2019-06-21 DIAGNOSIS — Z1231 Encounter for screening mammogram for malignant neoplasm of breast: Secondary | ICD-10-CM

## 2019-06-22 ENCOUNTER — Other Ambulatory Visit (HOSPITAL_COMMUNITY): Payer: Self-pay | Admitting: Hematology

## 2019-06-22 DIAGNOSIS — E611 Iron deficiency: Secondary | ICD-10-CM

## 2019-07-25 ENCOUNTER — Other Ambulatory Visit (HOSPITAL_COMMUNITY): Payer: BC Managed Care – PPO

## 2019-08-01 ENCOUNTER — Ambulatory Visit (HOSPITAL_COMMUNITY): Payer: BC Managed Care – PPO | Admitting: Hematology

## 2019-08-01 ENCOUNTER — Ambulatory Visit (HOSPITAL_COMMUNITY): Payer: BC Managed Care – PPO

## 2019-08-06 ENCOUNTER — Other Ambulatory Visit (HOSPITAL_COMMUNITY): Payer: Self-pay | Admitting: Hematology

## 2019-08-06 DIAGNOSIS — E611 Iron deficiency: Secondary | ICD-10-CM

## 2019-08-15 ENCOUNTER — Inpatient Hospital Stay (HOSPITAL_COMMUNITY): Payer: BC Managed Care – PPO | Attending: Nurse Practitioner

## 2019-08-15 ENCOUNTER — Other Ambulatory Visit: Payer: Self-pay

## 2019-08-15 DIAGNOSIS — M810 Age-related osteoporosis without current pathological fracture: Secondary | ICD-10-CM | POA: Diagnosis not present

## 2019-08-15 DIAGNOSIS — E876 Hypokalemia: Secondary | ICD-10-CM

## 2019-08-15 DIAGNOSIS — D649 Anemia, unspecified: Secondary | ICD-10-CM | POA: Insufficient documentation

## 2019-08-15 DIAGNOSIS — Z833 Family history of diabetes mellitus: Secondary | ICD-10-CM | POA: Diagnosis not present

## 2019-08-15 DIAGNOSIS — M7989 Other specified soft tissue disorders: Secondary | ICD-10-CM | POA: Insufficient documentation

## 2019-08-15 DIAGNOSIS — Z7289 Other problems related to lifestyle: Secondary | ICD-10-CM | POA: Insufficient documentation

## 2019-08-15 DIAGNOSIS — D6861 Antiphospholipid syndrome: Secondary | ICD-10-CM | POA: Diagnosis not present

## 2019-08-15 DIAGNOSIS — R5383 Other fatigue: Secondary | ICD-10-CM | POA: Diagnosis not present

## 2019-08-15 DIAGNOSIS — R110 Nausea: Secondary | ICD-10-CM | POA: Diagnosis not present

## 2019-08-15 DIAGNOSIS — Z8249 Family history of ischemic heart disease and other diseases of the circulatory system: Secondary | ICD-10-CM | POA: Insufficient documentation

## 2019-08-15 DIAGNOSIS — F17210 Nicotine dependence, cigarettes, uncomplicated: Secondary | ICD-10-CM | POA: Diagnosis not present

## 2019-08-15 DIAGNOSIS — Z820 Family history of epilepsy and other diseases of the nervous system: Secondary | ICD-10-CM | POA: Insufficient documentation

## 2019-08-15 DIAGNOSIS — Z8261 Family history of arthritis: Secondary | ICD-10-CM | POA: Diagnosis not present

## 2019-08-15 DIAGNOSIS — Z79899 Other long term (current) drug therapy: Secondary | ICD-10-CM | POA: Diagnosis not present

## 2019-08-15 LAB — COMPREHENSIVE METABOLIC PANEL
ALT: 28 U/L (ref 0–44)
AST: 31 U/L (ref 15–41)
Albumin: 4 g/dL (ref 3.5–5.0)
Alkaline Phosphatase: 81 U/L (ref 38–126)
Anion gap: 12 (ref 5–15)
BUN: 25 mg/dL — ABNORMAL HIGH (ref 6–20)
CO2: 22 mmol/L (ref 22–32)
Calcium: 9.1 mg/dL (ref 8.9–10.3)
Chloride: 98 mmol/L (ref 98–111)
Creatinine, Ser: 1.07 mg/dL — ABNORMAL HIGH (ref 0.44–1.00)
GFR calc Af Amer: >60 mL/min (ref >60)
GFR calc non Af Amer: 57 mL/min — ABNORMAL LOW (ref >60)
Glucose, Bld: 87 mg/dL (ref 70–99)
Potassium: 2.9 mmol/L — ABNORMAL LOW (ref 3.5–5.1)
Sodium: 132 mmol/L — ABNORMAL LOW (ref 135–145)
Total Bilirubin: 0.7 mg/dL (ref 0.3–1.2)
Total Protein: 6.7 g/dL (ref 6.5–8.1)

## 2019-08-15 LAB — LACTATE DEHYDROGENASE: LDH: 175 U/L (ref 98–192)

## 2019-08-15 LAB — FOLATE: Folate: 8.4 ng/mL (ref >5.9)

## 2019-08-15 LAB — CBC WITH DIFFERENTIAL/PLATELET
Abs Immature Granulocytes: 0.04 10*3/uL (ref 0.00–0.07)
Basophils Absolute: 0.1 10*3/uL (ref 0.0–0.1)
Basophils Relative: 1 %
Eosinophils Absolute: 0.2 10*3/uL (ref 0.0–0.5)
Eosinophils Relative: 3 %
HCT: 40 % (ref 36.0–46.0)
Hemoglobin: 12.9 g/dL (ref 12.0–15.0)
Immature Granulocytes: 1 %
Lymphocytes Relative: 20 %
Lymphs Abs: 1.3 10*3/uL (ref 0.7–4.0)
MCH: 31.1 pg (ref 26.0–34.0)
MCHC: 32.3 g/dL (ref 30.0–36.0)
MCV: 96.4 fL (ref 80.0–100.0)
Monocytes Absolute: 0.6 10*3/uL (ref 0.1–1.0)
Monocytes Relative: 9 %
Neutro Abs: 4.5 10*3/uL (ref 1.7–7.7)
Neutrophils Relative %: 66 %
Platelets: 196 10*3/uL (ref 150–400)
RBC: 4.15 MIL/uL (ref 3.87–5.11)
RDW: 14.2 % (ref 11.5–15.5)
WBC: 6.8 10*3/uL (ref 4.0–10.5)
nRBC: 0 % (ref 0.0–0.2)

## 2019-08-15 LAB — FERRITIN: Ferritin: 28 ng/mL (ref 11–307)

## 2019-08-15 LAB — IRON AND TIBC
Iron: 67 ug/dL (ref 28–170)
Saturation Ratios: 16 % (ref 10.4–31.8)
TIBC: 424 ug/dL (ref 250–450)
UIBC: 357 ug/dL

## 2019-08-15 LAB — VITAMIN B12: Vitamin B-12: 193 pg/mL (ref 180–914)

## 2019-08-16 ENCOUNTER — Other Ambulatory Visit (HOSPITAL_COMMUNITY): Payer: Self-pay | Admitting: Nurse Practitioner

## 2019-08-16 DIAGNOSIS — E876 Hypokalemia: Secondary | ICD-10-CM

## 2019-08-16 MED ORDER — POTASSIUM CHLORIDE CRYS ER 20 MEQ PO TBCR: 40.0000 meq | EXTENDED_RELEASE_TABLET | Freq: Every day | ORAL | 1 refills | Status: DC

## 2019-08-16 NOTE — Assessment & Plan Note (Signed)
1. Hypokalemia: -Labs done on 08/15/2019 showed her potassium 2.9. - Patient was called 3 times voice message left. -Prescription was called into her pharmacy for potassium.

## 2019-08-23 ENCOUNTER — Other Ambulatory Visit: Payer: Self-pay

## 2019-08-23 ENCOUNTER — Inpatient Hospital Stay (HOSPITAL_BASED_OUTPATIENT_CLINIC_OR_DEPARTMENT_OTHER): Payer: BC Managed Care – PPO | Admitting: Hematology

## 2019-08-23 ENCOUNTER — Inpatient Hospital Stay (HOSPITAL_COMMUNITY): Payer: BC Managed Care – PPO

## 2019-08-23 ENCOUNTER — Encounter (HOSPITAL_COMMUNITY): Payer: Self-pay | Admitting: Hematology

## 2019-08-23 VITALS — BP 116/77 | HR 78 | Temp 97.3°F | Resp 16 | Wt 179.3 lb

## 2019-08-23 DIAGNOSIS — E611 Iron deficiency: Secondary | ICD-10-CM

## 2019-08-23 DIAGNOSIS — D6861 Antiphospholipid syndrome: Secondary | ICD-10-CM

## 2019-08-23 DIAGNOSIS — E876 Hypokalemia: Secondary | ICD-10-CM | POA: Diagnosis not present

## 2019-08-23 DIAGNOSIS — M810 Age-related osteoporosis without current pathological fracture: Secondary | ICD-10-CM

## 2019-08-23 LAB — BASIC METABOLIC PANEL
Anion gap: 11 (ref 5–15)
BUN: 27 mg/dL — ABNORMAL HIGH (ref 6–20)
CO2: 27 mmol/L (ref 22–32)
Calcium: 9.3 mg/dL (ref 8.9–10.3)
Chloride: 96 mmol/L — ABNORMAL LOW (ref 98–111)
Creatinine, Ser: 1.16 mg/dL — ABNORMAL HIGH (ref 0.44–1.00)
GFR calc Af Amer: 60 mL/min — ABNORMAL LOW (ref >60)
GFR calc non Af Amer: 51 mL/min — ABNORMAL LOW (ref >60)
Glucose, Bld: 111 mg/dL — ABNORMAL HIGH (ref 70–99)
Potassium: 2.8 mmol/L — ABNORMAL LOW (ref 3.5–5.1)
Sodium: 134 mmol/L — ABNORMAL LOW (ref 135–145)

## 2019-08-23 MED ORDER — DENOSUMAB 60 MG/ML ~~LOC~~ SOSY
60.0000 mg | PREFILLED_SYRINGE | Freq: Once | SUBCUTANEOUS | Status: AC
  Administered 2019-08-23: 15:00:00 60 mg via SUBCUTANEOUS

## 2019-08-23 MED ORDER — DENOSUMAB 60 MG/ML ~~LOC~~ SOSY
PREFILLED_SYRINGE | SUBCUTANEOUS | Status: AC
  Filled 2019-08-23: qty 1

## 2019-08-23 MED ORDER — CYANOCOBALAMIN 1000 MCG/ML IJ SOLN
INTRAMUSCULAR | Status: AC
  Filled 2019-08-23: qty 1

## 2019-08-23 MED ORDER — FUROSEMIDE 20 MG PO TABS: 10.0000 mg | ORAL_TABLET | ORAL | 0 refills | Status: DC | PRN

## 2019-08-23 MED ORDER — CYANOCOBALAMIN 1000 MCG/ML IJ SOLN
1000.0000 ug | Freq: Once | INTRAMUSCULAR | Status: AC
  Administered 2019-08-23: 1000 ug via INTRAMUSCULAR

## 2019-08-23 NOTE — Progress Notes (Signed)
Patient to receive a B12 injection and Prolia today verbal order Dr. Delton Coombes.    Patient taking calcium as directed.  Denied tooth, jaw, and leg pain.  Patient tolerated injection with no complaints voiced.  Site clean and dry with no bruising or swelling noted at site.  Band aid applied.  Vss with discharge and left ambulatory with no s/s of distress noted.
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
## 2019-08-23 NOTE — Patient Instructions (Signed)
[place] at [facility] Discharge Instructions  You were seen today by Dr. Delton Coombes. He went over your recent lab results. He will schedule you for one dose of IV Iron. He has also sent in a new fluid pill for you to try.  He will see you back in 3 months for labs and follow up.  Start taking over the counter B12 supplement daily.  Thank you for choosing [place] at [facility] to provide your oncology and hematology care.  To afford each patient quality time with our provider, please arrive at least 15 minutes before your scheduled appointment time.   If you have a lab appointment with the [facility] please come in thru the  Main Entrance and check in at the main information desk  You need to re-schedule your appointment should you arrive 10 or more minutes late.  We strive to give you quality time with our providers, and arriving late affects you and other patients whose appointments are after yours.  Also, if you no show three or more times for appointments you may be dismissed from the clinic at the providers discretion.     Again, thank you for choosing [facility].  Our hope is that these requests will decrease the amount of time that you wait before being seen by our physicians.       _____________________________________________________________  Should you have questions after your visit to [facility], please contact our office at [phone] between the hours of 8:00 a.m. and 4:30 p.m.  Voicemails left after 4:00 p.m. will not be returned until the following business day.  For prescription refill requests, have your pharmacy contact our office and allow 72 hours.    Cancer Center Support Programs:   > Cancer Support Group  2nd Tuesday of the month 1pm-2pm, Journey Room

## 2019-08-23 NOTE — Assessment & Plan Note (Addendum)
1.  Antiphospholipid syndrome: -She had 5 episodes of DVT in both lower extremities.  She is on lifelong anticoagulation with Coumadin. - No recent blood clots in the past 6 months.  Will consider adding aspirin if there is any further thrombosis.  2.  Hypokalemia: -She is reportedly taking potassium 40 mEq twice daily.  However she is not compliant with potassium. -She is also taking HCTZ 3 times a week for her right leg swelling from prior DVT.  Perhaps she might be more sensitive to HCTZ and losing a lot of potassium.  I have given a prescription for Lasix 10 mg to be taken as needed in place of HCTZ. - She was told to continue potassium 40 mEq twice daily without missing doses.  We plan to repeat her potassium in 2 weeks.  3.  Iron deficiency state: -Her hemoglobin is 12.9.  Ferritin is 28. -Ferritin has been staying low even though she is taking oral iron therapy.  She is complaining of tiredness. -I have recommended 1 infusion of Feraheme.  We talked about the side effects in detail. - We will see her back in 3 months with repeat labs.  4.  Osteoporosis: - She was started on Prolia on 06/18/2016. -DEXA scan on 07/13/2018 shows T score of -3.0, significant increase from prior. -She will continue Prolia.  She will continue calcium.  5.  Borderline B12 levels: -B12 is borderline low at 193.  We will give her a B12 injection today. -She was told to take B12 tablet daily.  We will check levels at next visit.  6.  Health maintenance: -Last mammogram was on 05/16/2018, BI-RADS Category 1.

## 2019-09-01 ENCOUNTER — Inpatient Hospital Stay (HOSPITAL_COMMUNITY): Payer: BC Managed Care – PPO

## 2019-09-01 ENCOUNTER — Encounter (HOSPITAL_COMMUNITY): Payer: Self-pay

## 2019-09-01 ENCOUNTER — Other Ambulatory Visit: Payer: Self-pay

## 2019-09-01 VITALS — BP 87/60 | HR 62 | Temp 97.1°F | Resp 18

## 2019-09-01 DIAGNOSIS — E611 Iron deficiency: Secondary | ICD-10-CM

## 2019-09-01 DIAGNOSIS — D6861 Antiphospholipid syndrome: Secondary | ICD-10-CM | POA: Diagnosis not present

## 2019-09-01 DIAGNOSIS — M810 Age-related osteoporosis without current pathological fracture: Secondary | ICD-10-CM

## 2019-09-01 MED ORDER — SODIUM CHLORIDE 0.9 % IV SOLN
510.0000 mg | Freq: Once | INTRAVENOUS | Status: AC
  Administered 2019-09-01: 510 mg via INTRAVENOUS
  Filled 2019-09-01: qty 510

## 2019-09-01 MED ORDER — SODIUM CHLORIDE 0.9 % IV SOLN
Freq: Once | INTRAVENOUS | Status: AC
  Administered 2019-09-01: 13:00:00 via INTRAVENOUS

## 2019-09-01 NOTE — Patient Instructions (Signed)
[facility] at [facility]  Discharge Instructions:   _______________________________________________________________  Thank you for choosing [facility] at [facility] to provide your oncology and hematology care.  To afford each patient quality time with our providers, please arrive at least 15 minutes before your scheduled appointment.  You need to re-schedule your appointment if you arrive 10 or more minutes late.  We strive to give you quality time with our providers, and arriving late affects you and other patients whose appointments are after yours.  Also, if you no show three or more times for appointments you may be dismissed from the clinic.  Again, thank you for choosing [facility] at [facility]. Our hope is that these requests will allow you access to exceptional care and in a timely manner. _______________________________________________________________  If you have questions after your visit, please contact our office at [phone] between the hours of 8:30 a.m. and 5:00 p.m. Voicemails left after 4:30 p.m. will not be returned until the following business day. _______________________________________________________________  For prescription refill requests, have your pharmacy contact our office. _______________________________________________________________  Recommendations made by the consultant and any test results will be sent to your referring physician. _______________________________________________________________ 

## 2019-09-01 NOTE — Progress Notes (Signed)
Feraheme given per orders. Patient tolerated it well without problems. Vitals stable and discharged home from clinic ambulatory. Follow up as scheduled.  

## 2019-09-06 ENCOUNTER — Inpatient Hospital Stay (HOSPITAL_COMMUNITY): Payer: BC Managed Care – PPO

## 2019-09-06 ENCOUNTER — Other Ambulatory Visit: Payer: Self-pay

## 2019-09-06 DIAGNOSIS — E611 Iron deficiency: Secondary | ICD-10-CM

## 2019-09-06 DIAGNOSIS — E876 Hypokalemia: Secondary | ICD-10-CM

## 2019-09-06 DIAGNOSIS — D6861 Antiphospholipid syndrome: Secondary | ICD-10-CM | POA: Diagnosis not present

## 2019-09-06 LAB — CBC WITH DIFFERENTIAL/PLATELET
Abs Immature Granulocytes: 0.03 10*3/uL (ref 0.00–0.07)
Basophils Absolute: 0.1 10*3/uL (ref 0.0–0.1)
Basophils Relative: 1 %
Eosinophils Absolute: 0.2 10*3/uL (ref 0.0–0.5)
Eosinophils Relative: 3 %
HCT: 40.1 % (ref 36.0–46.0)
Hemoglobin: 12.8 g/dL (ref 12.0–15.0)
Immature Granulocytes: 0 %
Lymphocytes Relative: 17 %
Lymphs Abs: 1.4 10*3/uL (ref 0.7–4.0)
MCH: 30.8 pg (ref 26.0–34.0)
MCHC: 31.9 g/dL (ref 30.0–36.0)
MCV: 96.4 fL (ref 80.0–100.0)
Monocytes Absolute: 0.7 10*3/uL (ref 0.1–1.0)
Monocytes Relative: 8 %
Neutro Abs: 5.7 10*3/uL (ref 1.7–7.7)
Neutrophils Relative %: 71 %
Platelets: 220 10*3/uL (ref 150–400)
RBC: 4.16 MIL/uL (ref 3.87–5.11)
RDW: 14.5 % (ref 11.5–15.5)
WBC: 8 10*3/uL (ref 4.0–10.5)
nRBC: 0 % (ref 0.0–0.2)

## 2019-09-06 LAB — COMPREHENSIVE METABOLIC PANEL
ALT: 29 U/L (ref 0–44)
AST: 32 U/L (ref 15–41)
Albumin: 3.9 g/dL (ref 3.5–5.0)
Alkaline Phosphatase: 106 U/L (ref 38–126)
Anion gap: 8 (ref 5–15)
BUN: 22 mg/dL — ABNORMAL HIGH (ref 6–20)
CO2: 19 mmol/L — ABNORMAL LOW (ref 22–32)
Calcium: 8.1 mg/dL — ABNORMAL LOW (ref 8.9–10.3)
Chloride: 106 mmol/L (ref 98–111)
Creatinine, Ser: 0.95 mg/dL (ref 0.44–1.00)
GFR calc Af Amer: >60 mL/min (ref >60)
GFR calc non Af Amer: >60 mL/min (ref >60)
Glucose, Bld: 111 mg/dL — ABNORMAL HIGH (ref 70–99)
Potassium: 2.9 mmol/L — ABNORMAL LOW (ref 3.5–5.1)
Sodium: 133 mmol/L — ABNORMAL LOW (ref 135–145)
Total Bilirubin: 0.3 mg/dL (ref 0.3–1.2)
Total Protein: 6.5 g/dL (ref 6.5–8.1)

## 2019-09-06 LAB — IRON AND TIBC
Iron: 80 ug/dL (ref 28–170)
Saturation Ratios: 21 % (ref 10.4–31.8)
TIBC: 377 ug/dL (ref 250–450)
UIBC: 297 ug/dL

## 2019-09-06 LAB — FERRITIN: Ferritin: 261 ng/mL (ref 11–307)

## 2019-09-06 LAB — VITAMIN B12: Vitamin B-12: 857 pg/mL (ref 180–914)

## 2019-10-01 ENCOUNTER — Other Ambulatory Visit: Payer: Self-pay | Admitting: Neurology

## 2019-10-01 ENCOUNTER — Other Ambulatory Visit (HOSPITAL_COMMUNITY): Payer: Self-pay | Admitting: Nurse Practitioner

## 2019-10-01 DIAGNOSIS — D6861 Antiphospholipid syndrome: Secondary | ICD-10-CM

## 2019-11-14 ENCOUNTER — Other Ambulatory Visit (HOSPITAL_COMMUNITY): Payer: Self-pay | Admitting: *Deleted

## 2019-11-14 DIAGNOSIS — E611 Iron deficiency: Secondary | ICD-10-CM

## 2019-11-14 DIAGNOSIS — M810 Age-related osteoporosis without current pathological fracture: Secondary | ICD-10-CM

## 2019-11-15 ENCOUNTER — Inpatient Hospital Stay (HOSPITAL_COMMUNITY): Payer: BC Managed Care – PPO

## 2019-11-17 ENCOUNTER — Other Ambulatory Visit: Payer: Self-pay

## 2019-11-17 ENCOUNTER — Inpatient Hospital Stay (HOSPITAL_COMMUNITY): Payer: BC Managed Care – PPO | Attending: Hematology

## 2019-11-17 DIAGNOSIS — M810 Age-related osteoporosis without current pathological fracture: Secondary | ICD-10-CM

## 2019-11-17 DIAGNOSIS — E611 Iron deficiency: Secondary | ICD-10-CM | POA: Diagnosis not present

## 2019-11-17 LAB — CBC WITH DIFFERENTIAL/PLATELET
Abs Immature Granulocytes: 0.02 10*3/uL (ref 0.00–0.07)
Basophils Absolute: 0.1 10*3/uL (ref 0.0–0.1)
Basophils Relative: 1 %
Eosinophils Absolute: 0.2 10*3/uL (ref 0.0–0.5)
Eosinophils Relative: 3 %
HCT: 40.3 % (ref 36.0–46.0)
Hemoglobin: 12.8 g/dL (ref 12.0–15.0)
Immature Granulocytes: 0 %
Lymphocytes Relative: 25 %
Lymphs Abs: 1.4 10*3/uL (ref 0.7–4.0)
MCH: 31.1 pg (ref 26.0–34.0)
MCHC: 31.8 g/dL (ref 30.0–36.0)
MCV: 97.8 fL (ref 80.0–100.0)
Monocytes Absolute: 0.6 10*3/uL (ref 0.1–1.0)
Monocytes Relative: 10 %
Neutro Abs: 3.4 10*3/uL (ref 1.7–7.7)
Neutrophils Relative %: 61 %
Platelets: 182 10*3/uL (ref 150–400)
RBC: 4.12 MIL/uL (ref 3.87–5.11)
RDW: 13.6 % (ref 11.5–15.5)
WBC: 5.7 10*3/uL (ref 4.0–10.5)
nRBC: 0 % (ref 0.0–0.2)

## 2019-11-17 LAB — COMPREHENSIVE METABOLIC PANEL
ALT: 23 U/L (ref 0–44)
AST: 27 U/L (ref 15–41)
Albumin: 3.7 g/dL (ref 3.5–5.0)
Alkaline Phosphatase: 74 U/L (ref 38–126)
Anion gap: 9 (ref 5–15)
BUN: 21 mg/dL — ABNORMAL HIGH (ref 6–20)
CO2: 28 mmol/L (ref 22–32)
Calcium: 8.8 mg/dL — ABNORMAL LOW (ref 8.9–10.3)
Chloride: 100 mmol/L (ref 98–111)
Creatinine, Ser: 1.24 mg/dL — ABNORMAL HIGH (ref 0.44–1.00)
GFR calc Af Amer: 55 mL/min — ABNORMAL LOW (ref >60)
GFR calc non Af Amer: 48 mL/min — ABNORMAL LOW (ref >60)
Glucose, Bld: 91 mg/dL (ref 70–99)
Potassium: 3.1 mmol/L — ABNORMAL LOW (ref 3.5–5.1)
Sodium: 137 mmol/L (ref 135–145)
Total Bilirubin: 0.5 mg/dL (ref 0.3–1.2)
Total Protein: 6.5 g/dL (ref 6.5–8.1)

## 2019-11-17 LAB — IRON AND TIBC
Iron: 47 ug/dL (ref 28–170)
Saturation Ratios: 14 % (ref 10.4–31.8)
TIBC: 341 ug/dL (ref 250–450)
UIBC: 294 ug/dL

## 2019-11-17 LAB — FERRITIN: Ferritin: 71 ng/mL (ref 11–307)

## 2019-11-17 LAB — VITAMIN B12: Vitamin B-12: 735 pg/mL (ref 180–914)

## 2019-11-22 ENCOUNTER — Encounter (HOSPITAL_COMMUNITY): Payer: Self-pay | Admitting: Hematology

## 2019-11-22 ENCOUNTER — Inpatient Hospital Stay (HOSPITAL_BASED_OUTPATIENT_CLINIC_OR_DEPARTMENT_OTHER): Payer: BC Managed Care – PPO | Admitting: Hematology

## 2019-11-22 ENCOUNTER — Other Ambulatory Visit: Payer: Self-pay

## 2019-11-22 DIAGNOSIS — E611 Iron deficiency: Secondary | ICD-10-CM | POA: Diagnosis not present

## 2019-11-22 DIAGNOSIS — E876 Hypokalemia: Secondary | ICD-10-CM

## 2019-11-22 NOTE — Progress Notes (Signed)
Virtual Visit via Telephone Note  I connected with Samantha Fields on 11/22/19 at  3:05 PM EST by telephone and verified that I am speaking with the correct person using two identifiers.   I discussed the limitations, risks, security and privacy concerns of performing an evaluation and management service by telephone and the availability of in person appointments. I also discussed with the patient that there may be a patient responsible charge related to this service. The patient expressed understanding and agreed to proceed.   History of Present Illness: She is seen in our clinic for antiphospholipid syndrome on indefinite anticoagulation.  She also has recurrent hypokalemia she is on diuretics and not very compliant in taking potassium as prescribed.  She has iron deficiency state and gets intermittent Feraheme infusions.  She also gets Prolia injection for osteoporosis.   Observations/Objective: She reports decrease in energy levels in the past 1 month.  She felt better after last Feraheme infusion in September.  Denies any bleeding per rectum or melena.  She is taking potassium at least 2 pills a day.  She was told to take 3/day.  Denies any diarrhea.  Denies any jaw pains.  She is taking B12 tablet daily.  Assessment and Plan:  1.  Iron deficiency state: -Hemoglobin is 12.9.  Ferritin is 71. -Last Feraheme was on 09/01/2019. -She reports having tiredness.  We will give her 1 more infusion of Feraheme. -We will schedule her 85-month visit with repeat labs.  2.  Osteoporosis: -She was started on Prolia on 06/18/2016.  DEXA scan on 07/13/2018 shows T score of -3. -She will continue Prolia.  She will continue calcium supplements.  3.  Borderline B12 level: -Previous B12 was borderline.  She is taking B12 1 mg tablet. -Repeat B12 level was normal.  She will continue oral B12.  4.  Hypokalemia: -Potassium today is 3.1. -She is taking potassium 20 mEq 2 tablets daily.  She is forgetting the  third tablet. -She was reinforced to take potassium 2 tablets in the morning and 1 tablet in the evening.   Follow Up Instructions: RTC 3 months with follow-up.   I discussed the assessment and treatment plan with the patient. The patient was provided an opportunity to ask questions and all were answered. The patient agreed with the plan and demonstrated an understanding of the instructions.   The patient was advised to call back or seek an in-person evaluation if the symptoms worsen or if the condition fails to improve as anticipated.  I provided 15 minutes of non-face-to-face time during this encounter.   Derek Jack, MD

## 2019-12-01 ENCOUNTER — Ambulatory Visit (HOSPITAL_COMMUNITY): Payer: BC Managed Care – PPO

## 2019-12-06 ENCOUNTER — Other Ambulatory Visit (HOSPITAL_COMMUNITY): Payer: Self-pay | Admitting: Nurse Practitioner

## 2019-12-06 DIAGNOSIS — D6861 Antiphospholipid syndrome: Secondary | ICD-10-CM

## 2019-12-14 ENCOUNTER — Other Ambulatory Visit: Payer: Self-pay

## 2019-12-14 ENCOUNTER — Ambulatory Visit (HOSPITAL_COMMUNITY)
Admission: RE | Admit: 2019-12-14 | Discharge: 2019-12-14 | Disposition: A | Discharge status: Home / Self Care | Payer: BC Managed Care – PPO | Source: Ambulatory Visit | Attending: Adult Health | Admitting: Adult Health

## 2019-12-14 DIAGNOSIS — Z1231 Encounter for screening mammogram for malignant neoplasm of breast: Secondary | ICD-10-CM | POA: Insufficient documentation

## 2020-01-21 ENCOUNTER — Other Ambulatory Visit: Payer: Self-pay | Admitting: Neurology

## 2020-02-20 ENCOUNTER — Inpatient Hospital Stay (HOSPITAL_COMMUNITY): Payer: BC Managed Care – PPO

## 2020-02-21 ENCOUNTER — Inpatient Hospital Stay (HOSPITAL_COMMUNITY): Payer: BC Managed Care – PPO

## 2020-02-21 ENCOUNTER — Ambulatory Visit (HOSPITAL_COMMUNITY): Payer: BC Managed Care – PPO

## 2020-02-27 ENCOUNTER — Ambulatory Visit (HOSPITAL_COMMUNITY): Payer: BC Managed Care – PPO | Admitting: Hematology

## 2020-03-07 ENCOUNTER — Other Ambulatory Visit (HOSPITAL_COMMUNITY): Payer: Self-pay | Admitting: Nurse Practitioner

## 2020-03-07 DIAGNOSIS — E876 Hypokalemia: Secondary | ICD-10-CM

## 2020-03-25 ENCOUNTER — Other Ambulatory Visit (HOSPITAL_COMMUNITY): Payer: Self-pay | Admitting: Nurse Practitioner

## 2020-03-25 DIAGNOSIS — D6861 Antiphospholipid syndrome: Secondary | ICD-10-CM

## 2020-04-01 ENCOUNTER — Other Ambulatory Visit: Payer: Self-pay

## 2020-04-01 ENCOUNTER — Inpatient Hospital Stay (HOSPITAL_COMMUNITY): Payer: BC Managed Care – PPO | Attending: Hematology

## 2020-04-01 ENCOUNTER — Inpatient Hospital Stay (HOSPITAL_COMMUNITY): Payer: BC Managed Care – PPO

## 2020-04-01 ENCOUNTER — Encounter (HOSPITAL_COMMUNITY): Payer: Self-pay

## 2020-04-01 VITALS — BP 126/71 | HR 62 | Temp 97.1°F | Resp 18

## 2020-04-01 DIAGNOSIS — D6861 Antiphospholipid syndrome: Secondary | ICD-10-CM | POA: Diagnosis not present

## 2020-04-01 DIAGNOSIS — D509 Iron deficiency anemia, unspecified: Secondary | ICD-10-CM | POA: Diagnosis present

## 2020-04-01 DIAGNOSIS — E876 Hypokalemia: Secondary | ICD-10-CM | POA: Diagnosis not present

## 2020-04-01 DIAGNOSIS — M810 Age-related osteoporosis without current pathological fracture: Secondary | ICD-10-CM | POA: Insufficient documentation

## 2020-04-01 DIAGNOSIS — Z8616 Personal history of COVID-19: Secondary | ICD-10-CM | POA: Insufficient documentation

## 2020-04-01 DIAGNOSIS — R7989 Other specified abnormal findings of blood chemistry: Secondary | ICD-10-CM | POA: Diagnosis not present

## 2020-04-01 DIAGNOSIS — E611 Iron deficiency: Secondary | ICD-10-CM

## 2020-04-01 DIAGNOSIS — Z79899 Other long term (current) drug therapy: Secondary | ICD-10-CM | POA: Insufficient documentation

## 2020-04-01 LAB — CBC WITH DIFFERENTIAL/PLATELET
Abs Immature Granulocytes: 0.04 10*3/uL (ref 0.00–0.07)
Basophils Absolute: 0.1 10*3/uL (ref 0.0–0.1)
Basophils Relative: 1 %
Eosinophils Absolute: 0.2 10*3/uL (ref 0.0–0.5)
Eosinophils Relative: 3 %
HCT: 42 % (ref 36.0–46.0)
Hemoglobin: 13.2 g/dL (ref 12.0–15.0)
Immature Granulocytes: 1 %
Lymphocytes Relative: 23 %
Lymphs Abs: 1.7 10*3/uL (ref 0.7–4.0)
MCH: 30.9 pg (ref 26.0–34.0)
MCHC: 31.4 g/dL (ref 30.0–36.0)
MCV: 98.4 fL (ref 80.0–100.0)
Monocytes Absolute: 0.8 10*3/uL (ref 0.1–1.0)
Monocytes Relative: 11 %
Neutro Abs: 4.5 10*3/uL (ref 1.7–7.7)
Neutrophils Relative %: 61 %
Platelets: 242 10*3/uL (ref 150–400)
RBC: 4.27 MIL/uL (ref 3.87–5.11)
RDW: 13.3 % (ref 11.5–15.5)
WBC: 7.3 10*3/uL (ref 4.0–10.5)
nRBC: 0 % (ref 0.0–0.2)

## 2020-04-01 LAB — FERRITIN: Ferritin: 23 ng/mL (ref 11–307)

## 2020-04-01 LAB — COMPREHENSIVE METABOLIC PANEL
ALT: 24 U/L (ref 0–44)
AST: 24 U/L (ref 15–41)
Albumin: 4 g/dL (ref 3.5–5.0)
Alkaline Phosphatase: 99 U/L (ref 38–126)
Anion gap: 11 (ref 5–15)
BUN: 32 mg/dL — ABNORMAL HIGH (ref 6–20)
CO2: 29 mmol/L (ref 22–32)
Calcium: 9.3 mg/dL (ref 8.9–10.3)
Chloride: 94 mmol/L — ABNORMAL LOW (ref 98–111)
Creatinine, Ser: 1.13 mg/dL — ABNORMAL HIGH (ref 0.44–1.00)
GFR calc Af Amer: >60 mL/min (ref >60)
GFR calc non Af Amer: 53 mL/min — ABNORMAL LOW (ref >60)
Glucose, Bld: 89 mg/dL (ref 70–99)
Potassium: 3.2 mmol/L — ABNORMAL LOW (ref 3.5–5.1)
Sodium: 134 mmol/L — ABNORMAL LOW (ref 135–145)
Total Bilirubin: 0.6 mg/dL (ref 0.3–1.2)
Total Protein: 6.8 g/dL (ref 6.5–8.1)

## 2020-04-01 LAB — IRON AND TIBC
Iron: 54 ug/dL (ref 28–170)
Saturation Ratios: 14 % (ref 10.4–31.8)
TIBC: 391 ug/dL (ref 250–450)
UIBC: 337 ug/dL

## 2020-04-01 LAB — VITAMIN B12: Vitamin B-12: 499 pg/mL (ref 180–914)

## 2020-04-01 MED ORDER — DENOSUMAB 60 MG/ML ~~LOC~~ SOSY
60.0000 mg | PREFILLED_SYRINGE | Freq: Once | SUBCUTANEOUS | Status: AC
  Administered 2020-04-01: 15:00:00 60 mg via SUBCUTANEOUS
  Filled 2020-04-01: qty 1

## 2020-04-01 NOTE — Progress Notes (Signed)
New consent obtained for Prolia shot with side effects reviewed.  All questions asked and answered.    Patient taking calcium as directed.  Denied tooth, jaw, and leg pain.  No recent or upcoming dental visits.  Labs reviewed.  Patient tolerated injection with no complaints voiced.  See MAR for details.  Site clean and dry with no bruising or swelling noted.  Band aid applied.  Vss with discharge and left ambulatory with no s/s of distress noted.

## 2020-04-08 ENCOUNTER — Encounter (HOSPITAL_COMMUNITY): Payer: Self-pay | Admitting: Hematology

## 2020-04-08 ENCOUNTER — Inpatient Hospital Stay (HOSPITAL_BASED_OUTPATIENT_CLINIC_OR_DEPARTMENT_OTHER): Payer: BC Managed Care – PPO | Admitting: Hematology

## 2020-04-08 ENCOUNTER — Other Ambulatory Visit: Payer: Self-pay

## 2020-04-08 DIAGNOSIS — E611 Iron deficiency: Secondary | ICD-10-CM

## 2020-04-08 MED ORDER — FUROSEMIDE 20 MG PO TABS: 10.0000 mg | ORAL_TABLET | ORAL | 3 refills | Status: DC | PRN

## 2020-04-08 NOTE — Progress Notes (Signed)
Virtual Visit via Telephone Note  I connected with Samantha Fields on 04/08/20 at  3:05 PM EDT by telephone and verified that I am speaking with the correct person using two identifiers.   I discussed the limitations, risks, security and privacy concerns of performing an evaluation and management service by telephone and the availability of in person appointments. I also discussed with the patient that there may be a patient responsible charge related to this service. The patient expressed understanding and agreed to proceed.   History of Present Illness: Seen for follow-up of iron deficiency anemia, hypokalemia, antiphospholipid syndrome on indefinite anticoagulation.  She also receives Prolia for osteoporosis.   Observations/Objective: Reports that her energy levels have been fairly well.  Appetite and energy levels are recorded as 100%.  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  At last visit in December 2020, I have recommended Feraheme infusion for downtrending ferritin levels.  However she was unable to receive it as she became positive for Covid briefly after that.  Denies any bleeding per rectum or melena.  She is taking iron tablet daily.  She is taking potassium 2 pills/day.  She breaks them into 2 pieces or crushes them.  Even then she sometimes gags and her stomach hurts after taking potassium pill.  She requests refill for fluid pill.  Assessment and Plan:  1.  Iron deficiency state: -We reviewed labs from 04/01/2020.  Hemoglobin is 13.2.  Ferritin is down to 23 from 71.  Percent saturation is 14.  123456 and folic acid were normal. -She also has mild elevated creatinine of 1.13, likely from Lasix. -I have recommended for him x1 infusion as her ferritin decreased from 71-23. -I have recommended follow-up in 4 months with labs.  2.  Osteoporosis: -She will continue Prolia every 6 months.  Last dose was on 04/01/2020.  She will continue calcium supplements. -DEXA scan on 07/13/2018  shows T score of -3.  3.  Borderline B12 level: -She will continue B12 1 mg tablet daily.  B12 levels are normal.  4.  Hypokalemia: -Potassium today is 3.1.  She is taking 2 pills/day. -She has difficulty taking potassium supplements.  She will try to take 3 pills/day.   Follow Up Instructions: RTC 4 months with labs.   I discussed the assessment and treatment plan with the patient. The patient was provided an opportunity to ask questions and all were answered. The patient agreed with the plan and demonstrated an understanding of the instructions.   The patient was advised to call back or seek an in-person evaluation if the symptoms worsen or if the condition fails to improve as anticipated.  I provided 12 minutes of non-face-to-face time during this encounter.   Derek Jack, MD

## 2020-04-08 NOTE — Progress Notes (Signed)
Telehealth consent 

## 2020-04-17 ENCOUNTER — Inpatient Hospital Stay (HOSPITAL_COMMUNITY): Payer: BC Managed Care – PPO | Attending: Hematology

## 2020-04-17 ENCOUNTER — Other Ambulatory Visit: Payer: Self-pay

## 2020-04-17 VITALS — BP 98/61 | HR 58 | Temp 97.7°F | Resp 16

## 2020-04-17 DIAGNOSIS — E611 Iron deficiency: Secondary | ICD-10-CM | POA: Diagnosis present

## 2020-04-17 DIAGNOSIS — E538 Deficiency of other specified B group vitamins: Secondary | ICD-10-CM | POA: Diagnosis not present

## 2020-04-17 DIAGNOSIS — E876 Hypokalemia: Secondary | ICD-10-CM | POA: Insufficient documentation

## 2020-04-17 DIAGNOSIS — M810 Age-related osteoporosis without current pathological fracture: Secondary | ICD-10-CM | POA: Insufficient documentation

## 2020-04-17 MED ORDER — SODIUM CHLORIDE 0.9 % IV SOLN
510.0000 mg | Freq: Once | INTRAVENOUS | Status: AC
  Administered 2020-04-17: 510 mg via INTRAVENOUS
  Filled 2020-04-17: qty 510

## 2020-04-17 MED ORDER — SODIUM CHLORIDE 0.9 % IV SOLN: Freq: Once | INTRAVENOUS | Status: AC

## 2020-04-17 NOTE — Progress Notes (Signed)
Iron infusion given per orders. Patient tolerated it well without problems. Vitals stable and discharged home from clinic ambulatory. Follow up as scheduled.  

## 2020-04-25 ENCOUNTER — Other Ambulatory Visit: Payer: Self-pay | Admitting: Neurology

## 2020-05-01 ENCOUNTER — Other Ambulatory Visit: Payer: Self-pay | Admitting: Neurology

## 2020-05-03 ENCOUNTER — Telehealth: Payer: Self-pay | Admitting: Neurology
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
## 2020-05-06 ENCOUNTER — Other Ambulatory Visit: Payer: Self-pay | Admitting: *Deleted

## 2020-05-06 MED ORDER — TROKENDI XR 100 MG PO CP24: 2.0000 | ORAL_CAPSULE | Freq: Every day | ORAL | 0 refills | Status: DC

## 2020-05-06 NOTE — Telephone Encounter (Signed)
I spoke to the patient. She was last seen 09/01/2018 with instructions to continue follow up with PCP. There was a misunderstanding and she did not realize this was the plan.  She is completely out of medication now. She confirmed that she has been taking Trokendi XR 100mg , two capsules bedtime. I have sent in a refill today. She has a pending appt with Butler Denmark, NP on 05/07/20. The patient is aware that we would need to see her yearly in order to continue prescribing or she may discuss with her PCP.

## 2020-05-07 ENCOUNTER — Other Ambulatory Visit: Payer: Self-pay

## 2020-05-07 ENCOUNTER — Encounter: Payer: Self-pay | Admitting: Neurology

## 2020-05-07 ENCOUNTER — Ambulatory Visit: Payer: BC Managed Care – PPO | Admitting: Neurology

## 2020-05-07 VITALS — BP 122/72 | HR 78 | Ht 64.5 in | Wt 186.5 lb

## 2020-05-07 DIAGNOSIS — I679 Cerebrovascular disease, unspecified: Secondary | ICD-10-CM

## 2020-05-07 DIAGNOSIS — R569 Unspecified convulsions: Secondary | ICD-10-CM

## 2020-05-07 DIAGNOSIS — G3184 Mild cognitive impairment, so stated: Secondary | ICD-10-CM

## 2020-05-07 MED ORDER — TROKENDI XR 100 MG PO CP24: 2.0000 | ORAL_CAPSULE | Freq: Every day | ORAL | 4 refills | Status: DC

## 2020-05-07 MED ORDER — TOPIRAMATE 100 MG PO TABS: 100.0000 mg | ORAL_TABLET | Freq: Two times a day (BID) | ORAL | 0 refills | Status: DC
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
## 2020-05-07 NOTE — Telephone Encounter (Signed)
The patient informed me today that Trokendi XR will require a PA. She is completely out of medication. I have initiated the request urgently through covermymeds (key: [id number]). She has coverage through [facility]. Decision pending.

## 2020-05-08 NOTE — Telephone Encounter (Signed)
Received fax from [facility] that PA approved 05/07/2020-05/07/2021. PA# [place] [id number] Non-grandfathered [id number].

## 2020-05-24 ENCOUNTER — Other Ambulatory Visit: Payer: Self-pay | Admitting: Adult Health

## 2020-05-29 ENCOUNTER — Telehealth: Payer: Self-pay | Admitting: Adult Health

## 2020-05-29 MED ORDER — CITALOPRAM HYDROBROMIDE 40 MG PO TABS: 40.0000 mg | ORAL_TABLET | Freq: Every day | ORAL | 0 refills | Status: DC

## 2020-05-29 NOTE — Telephone Encounter (Signed)
Pt request lexparo refill

## 2020-05-29 NOTE — Telephone Encounter (Signed)
Refilled celexa

## 2020-05-29 NOTE — Addendum Note (Signed)
Addended by: [name] A on: 05/29/2020 03:06 PM   Modules accepted: Orders

## 2020-05-29 NOTE — Telephone Encounter (Signed)
Telephoned patient at home number and patient states has appointment scheduled for July 26. Patient would like refill on Lexapro. Advised patient to check with pharmacy later. Patient voiced understanding.

## 2020-05-30 ENCOUNTER — Other Ambulatory Visit (HOSPITAL_COMMUNITY): Payer: Self-pay | Admitting: Nurse Practitioner

## 2020-05-30 DIAGNOSIS — E876 Hypokalemia: Secondary | ICD-10-CM

## 2020-06-10 ENCOUNTER — Other Ambulatory Visit (HOSPITAL_COMMUNITY): Payer: Self-pay | Admitting: Nurse Practitioner

## 2020-06-10 DIAGNOSIS — D6861 Antiphospholipid syndrome: Secondary | ICD-10-CM

## 2020-07-08 ENCOUNTER — Ambulatory Visit (INDEPENDENT_AMBULATORY_CARE_PROVIDER_SITE_OTHER): Payer: BC Managed Care – PPO | Admitting: Adult Health

## 2020-07-08 ENCOUNTER — Encounter: Payer: Self-pay | Admitting: Adult Health

## 2020-07-08 VITALS — BP 93/60 | HR 100 | Ht 63.25 in | Wt 176.0 lb

## 2020-07-08 DIAGNOSIS — F419 Anxiety disorder, unspecified: Secondary | ICD-10-CM | POA: Diagnosis not present

## 2020-07-08 DIAGNOSIS — Z01419 Encounter for gynecological examination (general) (routine) without abnormal findings: Secondary | ICD-10-CM

## 2020-07-08 DIAGNOSIS — Z1212 Encounter for screening for malignant neoplasm of rectum: Secondary | ICD-10-CM

## 2020-07-08 DIAGNOSIS — Z1211 Encounter for screening for malignant neoplasm of colon: Secondary | ICD-10-CM

## 2020-07-08 DIAGNOSIS — K219 Gastro-esophageal reflux disease without esophagitis: Secondary | ICD-10-CM

## 2020-07-08 LAB — HEMOCCULT GUIAC POC 1CARD (OFFICE): Fecal Occult Blood, POC: NEGATIVE

## 2020-07-08 MED ORDER — CITALOPRAM HYDROBROMIDE 40 MG PO TABS: 40.0000 mg | ORAL_TABLET | Freq: Every day | ORAL | 4 refills | Status: DC

## 2020-07-08 NOTE — Progress Notes (Signed)
Patient ID: Samantha Fields, female   DOB: June 05, 1961, 59 y.o.   MRN: [id number] History of Present Illness:  Samantha Fields is a 59 year old white female,married, sp hysterectomy,retired, in for a well woman gyn exam. PCP is Dr Nevada Crane.  Current Medications, Allergies, Past Medical History, Past Surgical History, Family History and Social History were reviewed in [employer] record.     Review of Systems: Patient denies any headaches, hearing loss, fatigue, blurred vision, shortness of breath, chest pain, abdominal pain, problems with bowel movements, urination, or intercourse. No joint pain or mood swings. Has acid reflux esp at night, using TUMS,can not eat fried foods     Physical Exam:BP (!) 93/60 (BP Location: Left Arm, Patient Position: Sitting, Cuff Size: Normal)    Pulse 100    Ht 5' 3.25" (1.607 m)    Wt 176 lb (79.8 kg)    BMI 30.93 kg/m  General:  Well developed, well nourished, no acute distress Skin:  Warm and dry Neck:  Midline trachea, normal thyroid, good ROM, no lymphadenopathy,no carotid bruits heard Lungs; Clear to auscultation bilaterally Breast:  No dominant palpable mass, retraction, or nipple discharge Cardiovascular: Regular rate and rhythm Abdomen:  Soft, non tender, no hepatosplenomegaly Pelvic:  External genitalia is normal in appearance, no lesions.  The vagina is pale with loss of moisture and rugae. Urethra has no lesions or masses. The cervix and uterus are absent.   No adnexal masses or tenderness noted.Bladder is non tender, no masses felt. Rectal: Good sphincter tone, no polyps, or hemorrhoids felt.  Hemoccult negative. Extremities/musculoskeletal:  No swelling or varicosities noted, no clubbing or cyanosis Psych:  No mood changes, alert and cooperative,seems happy AA is 1 Fall risk is low PHQ 9 score is 0 Examination chaperoned by Rolena Infante LPN  Impression and Plan: 1. Well woman exam with routine gynecological exam Physical in 1  year Mammogram yearly Labs with PCP   2. Screening for colorectal cancer   3. Anxiety Continue celexa Meds ordered this encounter  Medications   citalopram (CELEXA) 40 MG tablet    Sig: Take 1 tablet (40 mg total) by mouth daily.    Dispense:  90 tablet    Refill:  4    Order Specific Question:   Supervising Provider    Answer:   Elonda Husky, LUTHER H [[id number]]    4. Gastroesophageal reflux disease, unspecified whether esophagitis present Try prilosex OTC

## 2020-07-14 ENCOUNTER — Other Ambulatory Visit (HOSPITAL_COMMUNITY): Payer: Self-pay | Admitting: Nurse Practitioner

## 2020-07-14 DIAGNOSIS — E876 Hypokalemia: Secondary | ICD-10-CM

## 2020-07-24 ENCOUNTER — Other Ambulatory Visit: Payer: Self-pay | Admitting: Adult Health

## 2020-07-28 ENCOUNTER — Other Ambulatory Visit: Payer: Self-pay | Admitting: Adult Health

## 2020-08-01 ENCOUNTER — Other Ambulatory Visit (HOSPITAL_COMMUNITY): Payer: BC Managed Care – PPO

## 2020-08-05 ENCOUNTER — Inpatient Hospital Stay (HOSPITAL_COMMUNITY): Payer: BC Managed Care – PPO | Attending: Hematology

## 2020-08-05 ENCOUNTER — Telehealth (HOSPITAL_COMMUNITY): Payer: Self-pay | Admitting: Surgery

## 2020-08-05 ENCOUNTER — Other Ambulatory Visit: Payer: Self-pay

## 2020-08-05 DIAGNOSIS — E611 Iron deficiency: Secondary | ICD-10-CM | POA: Diagnosis not present

## 2020-08-05 LAB — CBC WITH DIFFERENTIAL/PLATELET
Abs Immature Granulocytes: 0.05 10*3/uL (ref 0.00–0.07)
Basophils Absolute: 0.1 10*3/uL (ref 0.0–0.1)
Basophils Relative: 1 %
Eosinophils Absolute: 0.2 10*3/uL (ref 0.0–0.5)
Eosinophils Relative: 3 %
HCT: 45.9 % (ref 36.0–46.0)
Hemoglobin: 14.4 g/dL (ref 12.0–15.0)
Immature Granulocytes: 1 %
Lymphocytes Relative: 16 %
Lymphs Abs: 1.1 10*3/uL (ref 0.7–4.0)
MCH: 30.6 pg (ref 26.0–34.0)
MCHC: 31.4 g/dL (ref 30.0–36.0)
MCV: 97.7 fL (ref 80.0–100.0)
Monocytes Absolute: 0.7 10*3/uL (ref 0.1–1.0)
Monocytes Relative: 10 %
Neutro Abs: 5 10*3/uL (ref 1.7–7.7)
Neutrophils Relative %: 69 %
Platelets: 246 10*3/uL (ref 150–400)
RBC: 4.7 MIL/uL (ref 3.87–5.11)
RDW: 14.1 % (ref 11.5–15.5)
WBC: 7 10*3/uL (ref 4.0–10.5)
nRBC: 0 % (ref 0.0–0.2)

## 2020-08-05 LAB — IRON AND TIBC
Iron: 113 ug/dL (ref 28–170)
Saturation Ratios: 31 % (ref 10.4–31.8)
TIBC: 365 ug/dL (ref 250–450)
UIBC: 252 ug/dL

## 2020-08-05 LAB — COMPREHENSIVE METABOLIC PANEL
ALT: 47 U/L — ABNORMAL HIGH (ref 0–44)
AST: 38 U/L (ref 15–41)
Albumin: 4 g/dL (ref 3.5–5.0)
Alkaline Phosphatase: 91 U/L (ref 38–126)
Anion gap: 9 (ref 5–15)
BUN: 21 mg/dL — ABNORMAL HIGH (ref 6–20)
CO2: 25 mmol/L (ref 22–32)
Calcium: 9.1 mg/dL (ref 8.9–10.3)
Chloride: 103 mmol/L (ref 98–111)
Creatinine, Ser: 1.02 mg/dL — ABNORMAL HIGH (ref 0.44–1.00)
GFR calc Af Amer: >60 mL/min (ref >60)
GFR calc non Af Amer: 60 mL/min — ABNORMAL LOW (ref >60)
Glucose, Bld: 98 mg/dL (ref 70–99)
Potassium: 2.7 mmol/L — CL (ref 3.5–5.1)
Sodium: 137 mmol/L (ref 135–145)
Total Bilirubin: 0.6 mg/dL (ref 0.3–1.2)
Total Protein: 7 g/dL (ref 6.5–8.1)

## 2020-08-05 LAB — VITAMIN B12: Vitamin B-12: 552 pg/mL (ref 180–914)

## 2020-08-05 LAB — FERRITIN: Ferritin: 225 ng/mL (ref 11–307)

## 2020-08-05 NOTE — Telephone Encounter (Signed)
CRITICAL VALUE ALERT Critical value received:  Potassium 2.7 Date of notification:  08/05/20 Time of notification: 11:09 Critical value read back: yes Nurse who received alert:  Jak Haggar MD notified time and response:  11:10 Dr. Delton Coombes  Per Dr. Delton Coombes, the pt is to take 4 total potassium tablets today only, and then continue taking 3 tablets every day.  I called the pt and she verbalized understanding of these instructions and was told to call back if she had any questions or concerns.

## 2020-08-08 ENCOUNTER — Inpatient Hospital Stay (HOSPITAL_BASED_OUTPATIENT_CLINIC_OR_DEPARTMENT_OTHER): Payer: BC Managed Care – PPO | Admitting: Hematology

## 2020-08-08 DIAGNOSIS — E611 Iron deficiency: Secondary | ICD-10-CM | POA: Diagnosis not present

## 2020-08-08 NOTE — Progress Notes (Signed)
Virtual Visit via Telephone Note  I connected with Samantha Fields on 08/08/20 at  3:30 PM EDT by telephone and verified that I am speaking with the correct person using two identifiers.   I discussed the limitations, risks, security and privacy concerns of performing an evaluation and management service by telephone and the availability of in person appointments. I also discussed with the patient that there may be a patient responsible charge related to this service. The patient expressed understanding and agreed to proceed.   History of Present Illness: She is seen in our clinic for follow-up of iron deficiency anemia, hypokalemia, antiphospholipid syndrome on indefinite anticoagulation.  She is also on Prolia for osteoporosis.   Observations/Objective: Reports that her energy levels have been good.  Appetite is also reported 100%.  No pain reported.  No diarrhea.  She is taking potassium 2 tablets in the morning and 1 tablet in the evening.  She is also taking calcium 600 mg 2 tablets daily.  She is taking Coumadin without any bleeding issues.  Assessment and Plan:  1.  Antiphospholipid syndrome: -Because of multiple DVTs, she will continue indefinite anticoagulation with Coumadin.  2.  Iron deficiency state: -We reviewed labs from 08/05/2020.  Ferritin improved to 225 as she received Feraheme on 04/17/2020.  Hemoglobin is 14.4.  B12 was normal. -She does not require any parenteral iron therapy.  We will plan to see her back in 4 months with repeat labs.  3.  Osteoporosis: -Continue Prolia every 6 months.  Continue calcium 1200 mg daily.  4.  Borderline B12 level: -Continue B12 1 mg tablet daily.  B12 level is normal.  5.  Hypokalemia: -Potassium was 2.7 on 08/05/2020.  She was given extra potassium.  She will continue potassium 2 tablets in the morning and 1 tablet in the evening.   Follow Up Instructions:    I discussed the assessment and treatment plan with the patient. The patient  was provided an opportunity to ask questions and all were answered. The patient agreed with the plan and demonstrated an understanding of the instructions.   The patient was advised to call back or seek an in-person evaluation if the symptoms worsen or if the condition fails to improve as anticipated.  I provided 11 minutes of non-face-to-face time during this encounter.   Derek Jack, MD

## 2020-08-22 ENCOUNTER — Other Ambulatory Visit: Payer: Self-pay | Admitting: Adult Health

## 2020-08-22 ENCOUNTER — Other Ambulatory Visit (HOSPITAL_COMMUNITY): Payer: Self-pay | Admitting: Nurse Practitioner

## 2020-08-22 DIAGNOSIS — E876 Hypokalemia: Secondary | ICD-10-CM

## 2020-09-09 ENCOUNTER — Other Ambulatory Visit (HOSPITAL_COMMUNITY): Payer: Self-pay | Admitting: Nurse Practitioner

## 2020-09-09 ENCOUNTER — Other Ambulatory Visit (HOSPITAL_COMMUNITY): Payer: Self-pay | Admitting: Hematology

## 2020-09-09 DIAGNOSIS — E611 Iron deficiency: Secondary | ICD-10-CM

## 2020-09-09 DIAGNOSIS — D6861 Antiphospholipid syndrome: Secondary | ICD-10-CM

## 2020-09-11 ENCOUNTER — Other Ambulatory Visit: Payer: Self-pay | Admitting: Nephrology

## 2020-09-11 DIAGNOSIS — N1831 Chronic kidney disease, stage 3a: Secondary | ICD-10-CM

## 2020-09-24 ENCOUNTER — Other Ambulatory Visit (HOSPITAL_COMMUNITY): Payer: Self-pay | Admitting: Surgery

## 2020-09-24 ENCOUNTER — Other Ambulatory Visit: Payer: Self-pay | Admitting: Neurology

## 2020-09-24 DIAGNOSIS — E876 Hypokalemia: Secondary | ICD-10-CM

## 2020-09-24 MED ORDER — POTASSIUM CHLORIDE CRYS ER 20 MEQ PO TBCR: EXTENDED_RELEASE_TABLET | ORAL | 0 refills | Status: DC

## 2020-09-27 ENCOUNTER — Ambulatory Visit (HOSPITAL_COMMUNITY)
Admission: RE | Admit: 2020-09-27 | Discharge: 2020-09-27 | Disposition: A | Discharge status: Home / Self Care | Payer: BC Managed Care – PPO | Source: Ambulatory Visit | Attending: Nephrology | Admitting: Nephrology

## 2020-09-27 ENCOUNTER — Other Ambulatory Visit: Payer: Self-pay

## 2020-09-27 DIAGNOSIS — N1831 Chronic kidney disease, stage 3a: Secondary | ICD-10-CM

## 2020-10-02 ENCOUNTER — Inpatient Hospital Stay (HOSPITAL_COMMUNITY): Payer: BC Managed Care – PPO | Attending: Hematology

## 2020-10-02 ENCOUNTER — Ambulatory Visit (HOSPITAL_COMMUNITY): Payer: BC Managed Care – PPO

## 2020-10-02 ENCOUNTER — Other Ambulatory Visit: Payer: Self-pay

## 2020-10-02 DIAGNOSIS — Z86718 Personal history of other venous thrombosis and embolism: Secondary | ICD-10-CM | POA: Insufficient documentation

## 2020-10-02 DIAGNOSIS — M810 Age-related osteoporosis without current pathological fracture: Secondary | ICD-10-CM | POA: Diagnosis not present

## 2020-10-02 DIAGNOSIS — E611 Iron deficiency: Secondary | ICD-10-CM | POA: Diagnosis not present

## 2020-10-02 DIAGNOSIS — E876 Hypokalemia: Secondary | ICD-10-CM | POA: Insufficient documentation

## 2020-10-02 DIAGNOSIS — D6861 Antiphospholipid syndrome: Secondary | ICD-10-CM | POA: Diagnosis not present

## 2020-10-02 DIAGNOSIS — Z79810 Long term (current) use of selective estrogen receptor modulators (SERMs): Secondary | ICD-10-CM | POA: Insufficient documentation

## 2020-10-02 LAB — COMPREHENSIVE METABOLIC PANEL
ALT: 41 U/L (ref 0–44)
AST: 34 U/L (ref 15–41)
Albumin: 4 g/dL (ref 3.5–5.0)
Alkaline Phosphatase: 97 U/L (ref 38–126)
Anion gap: 13 (ref 5–15)
BUN: 20 mg/dL (ref 6–20)
CO2: 27 mmol/L (ref 22–32)
Calcium: 10.1 mg/dL (ref 8.9–10.3)
Chloride: 99 mmol/L (ref 98–111)
Creatinine, Ser: 1.33 mg/dL — ABNORMAL HIGH (ref 0.44–1.00)
GFR, Estimated: 43 mL/min — ABNORMAL LOW (ref >60)
Glucose, Bld: 60 mg/dL — ABNORMAL LOW (ref 70–99)
Potassium: 3.8 mmol/L (ref 3.5–5.1)
Sodium: 139 mmol/L (ref 135–145)
Total Bilirubin: 1 mg/dL (ref 0.3–1.2)
Total Protein: 7.1 g/dL (ref 6.5–8.1)

## 2020-10-02 LAB — VITAMIN D 25 HYDROXY (VIT D DEFICIENCY, FRACTURES): Vit D, 25-Hydroxy: 20.24 ng/mL — ABNORMAL LOW (ref 30–100)

## 2020-10-02 LAB — CBC WITH DIFFERENTIAL/PLATELET
Abs Immature Granulocytes: 0.02 10*3/uL (ref 0.00–0.07)
Basophils Absolute: 0.1 10*3/uL (ref 0.0–0.1)
Basophils Relative: 2 %
Eosinophils Absolute: 0.2 10*3/uL (ref 0.0–0.5)
Eosinophils Relative: 3 %
HCT: 43.8 % (ref 36.0–46.0)
Hemoglobin: 13.6 g/dL (ref 12.0–15.0)
Immature Granulocytes: 0 %
Lymphocytes Relative: 21 %
Lymphs Abs: 1.2 10*3/uL (ref 0.7–4.0)
MCH: 31.3 pg (ref 26.0–34.0)
MCHC: 31.1 g/dL (ref 30.0–36.0)
MCV: 100.7 fL — ABNORMAL HIGH (ref 80.0–100.0)
Monocytes Absolute: 0.8 10*3/uL (ref 0.1–1.0)
Monocytes Relative: 13 %
Neutro Abs: 3.7 10*3/uL (ref 1.7–7.7)
Neutrophils Relative %: 61 %
Platelets: 199 10*3/uL (ref 150–400)
RBC: 4.35 MIL/uL (ref 3.87–5.11)
RDW: 13.3 % (ref 11.5–15.5)
WBC: 6 10*3/uL (ref 4.0–10.5)
nRBC: 0 % (ref 0.0–0.2)

## 2020-10-02 LAB — IRON AND TIBC
Iron: 100 ug/dL (ref 28–170)
Saturation Ratios: 27 % (ref 10.4–31.8)
TIBC: 377 ug/dL (ref 250–450)
UIBC: 277 ug/dL

## 2020-10-02 LAB — VITAMIN B12: Vitamin B-12: 923 pg/mL — ABNORMAL HIGH (ref 180–914)

## 2020-10-02 LAB — FERRITIN: Ferritin: 137 ng/mL (ref 11–307)

## 2020-10-04 ENCOUNTER — Other Ambulatory Visit: Payer: Self-pay | Admitting: Neurology

## 2020-10-08 ENCOUNTER — Telehealth: Payer: Self-pay | Admitting: Neurology

## 2020-10-08 MED ORDER — TROKENDI XR 100 MG PO CP24: 2.0000 | ORAL_CAPSULE | Freq: Every day | ORAL | 3 refills | Status: DC

## 2020-10-08 NOTE — Telephone Encounter (Signed)
Pt called, pharmacy has faxed prescription for Topamax twice.  I have one day left of medication. Would like a call from the nurse.

## 2020-10-08 NOTE — Telephone Encounter (Signed)
Last sent 05/07/20 for 90-days x 4. I called the pharmacy and they deleted the additional refills in error. New rx sent today. I called the patient to provide her with this update. She will be due for her yearly follow up in May 2022. She plans to call back to schedule this appt.

## 2020-10-11 ENCOUNTER — Other Ambulatory Visit (HOSPITAL_COMMUNITY): Payer: Self-pay | Admitting: Nephrology

## 2020-10-11 DIAGNOSIS — N1831 Chronic kidney disease, stage 3a: Secondary | ICD-10-CM

## 2020-10-11 DIAGNOSIS — Q632 Ectopic kidney: Secondary | ICD-10-CM

## 2020-10-25 ENCOUNTER — Other Ambulatory Visit (HOSPITAL_COMMUNITY): Payer: BC Managed Care – PPO

## 2020-10-30 ENCOUNTER — Other Ambulatory Visit (HOSPITAL_COMMUNITY): Payer: Self-pay | Admitting: Hematology

## 2020-10-30 DIAGNOSIS — E876 Hypokalemia: Secondary | ICD-10-CM

## 2020-11-01 ENCOUNTER — Encounter (HOSPITAL_COMMUNITY): Payer: Self-pay

## 2020-11-01 ENCOUNTER — Encounter (HOSPITAL_COMMUNITY)
Admission: RE | Admit: 2020-11-01 | Discharge: 2020-11-01 | Disposition: A | Discharge status: Home / Self Care | Payer: BC Managed Care – PPO | Source: Ambulatory Visit | Attending: Nephrology | Admitting: Nephrology

## 2020-11-01 ENCOUNTER — Other Ambulatory Visit: Payer: Self-pay

## 2020-11-01 DIAGNOSIS — N1831 Chronic kidney disease, stage 3a: Secondary | ICD-10-CM | POA: Insufficient documentation

## 2020-11-01 DIAGNOSIS — Q632 Ectopic kidney: Secondary | ICD-10-CM | POA: Diagnosis present

## 2020-11-01 HISTORY — DX: Disorder of kidney and ureter, unspecified: N28.9

## 2020-11-01 MED ORDER — FUROSEMIDE 10 MG/ML IJ SOLN: 40.0000 mg | Freq: Once | INTRAMUSCULAR | Status: AC

## 2020-11-01 MED ORDER — FUROSEMIDE 10 MG/ML IJ SOLN
INTRAMUSCULAR | Status: AC
  Administered 2020-11-01: 40 mg via INTRAVENOUS
  Filled 2020-11-01: qty 4

## 2020-11-01 MED ORDER — TECHNETIUM TC 99M MERTIATIDE
5.0000 | Freq: Once | INTRAVENOUS | Status: AC | PRN
  Administered 2020-11-01: 5 via INTRAVENOUS

## 2020-12-10 ENCOUNTER — Other Ambulatory Visit (HOSPITAL_COMMUNITY): Payer: Self-pay | Admitting: Surgery

## 2020-12-10 DIAGNOSIS — D6861 Antiphospholipid syndrome: Secondary | ICD-10-CM

## 2020-12-10 MED ORDER — WARFARIN SODIUM 3 MG PO TABS: 3.0000 mg | ORAL_TABLET | Freq: Every day | ORAL | 3 refills | Status: DC

## 2020-12-12 ENCOUNTER — Other Ambulatory Visit (HOSPITAL_COMMUNITY): Payer: Self-pay | Admitting: *Deleted

## 2020-12-12 DIAGNOSIS — D500 Iron deficiency anemia secondary to blood loss (chronic): Secondary | ICD-10-CM

## 2020-12-12 DIAGNOSIS — M810 Age-related osteoporosis without current pathological fracture: Secondary | ICD-10-CM

## 2020-12-12 DIAGNOSIS — E611 Iron deficiency: Secondary | ICD-10-CM

## 2020-12-16 ENCOUNTER — Inpatient Hospital Stay (HOSPITAL_COMMUNITY): Payer: BC Managed Care – PPO

## 2020-12-16 ENCOUNTER — Ambulatory Visit (HOSPITAL_COMMUNITY): Payer: BC Managed Care – PPO

## 2020-12-17 ENCOUNTER — Other Ambulatory Visit (HOSPITAL_COMMUNITY): Payer: BC Managed Care – PPO

## 2020-12-17 ENCOUNTER — Ambulatory Visit (HOSPITAL_COMMUNITY): Payer: BC Managed Care – PPO

## 2020-12-17 ENCOUNTER — Ambulatory Visit (HOSPITAL_COMMUNITY): Payer: BC Managed Care – PPO | Admitting: Hematology

## 2020-12-19 ENCOUNTER — Other Ambulatory Visit (HOSPITAL_COMMUNITY): Payer: BC Managed Care – PPO

## 2020-12-26 ENCOUNTER — Ambulatory Visit (HOSPITAL_COMMUNITY): Payer: BC Managed Care – PPO | Admitting: Hematology

## 2020-12-27 ENCOUNTER — Inpatient Hospital Stay (HOSPITAL_COMMUNITY): Payer: BC Managed Care – PPO

## 2020-12-27 ENCOUNTER — Other Ambulatory Visit: Payer: Self-pay

## 2020-12-27 ENCOUNTER — Inpatient Hospital Stay (HOSPITAL_COMMUNITY): Payer: BC Managed Care – PPO | Attending: Hematology

## 2020-12-27 ENCOUNTER — Encounter (HOSPITAL_COMMUNITY): Payer: Self-pay

## 2020-12-27 VITALS — BP 102/62 | HR 69 | Temp 96.7°F | Resp 18

## 2020-12-27 DIAGNOSIS — Z23 Encounter for immunization: Secondary | ICD-10-CM

## 2020-12-27 DIAGNOSIS — D500 Iron deficiency anemia secondary to blood loss (chronic): Secondary | ICD-10-CM

## 2020-12-27 DIAGNOSIS — M810 Age-related osteoporosis without current pathological fracture: Secondary | ICD-10-CM | POA: Diagnosis not present

## 2020-12-27 DIAGNOSIS — E538 Deficiency of other specified B group vitamins: Secondary | ICD-10-CM | POA: Insufficient documentation

## 2020-12-27 DIAGNOSIS — E611 Iron deficiency: Secondary | ICD-10-CM

## 2020-12-27 DIAGNOSIS — D509 Iron deficiency anemia, unspecified: Secondary | ICD-10-CM | POA: Insufficient documentation

## 2020-12-27 DIAGNOSIS — Z79899 Other long term (current) drug therapy: Secondary | ICD-10-CM | POA: Diagnosis not present

## 2020-12-27 DIAGNOSIS — D508 Other iron deficiency anemias: Secondary | ICD-10-CM

## 2020-12-27 LAB — CBC WITH DIFFERENTIAL/PLATELET
Abs Immature Granulocytes: 0.01 10*3/uL (ref 0.00–0.07)
Basophils Absolute: 0.1 10*3/uL (ref 0.0–0.1)
Basophils Relative: 1 %
Eosinophils Absolute: 0.2 10*3/uL (ref 0.0–0.5)
Eosinophils Relative: 4 %
HCT: 43.2 % (ref 36.0–46.0)
Hemoglobin: 13.3 g/dL (ref 12.0–15.0)
Immature Granulocytes: 0 %
Lymphocytes Relative: 22 %
Lymphs Abs: 1.1 10*3/uL (ref 0.7–4.0)
MCH: 30.8 pg (ref 26.0–34.0)
MCHC: 30.8 g/dL (ref 30.0–36.0)
MCV: 100 fL (ref 80.0–100.0)
Monocytes Absolute: 0.5 10*3/uL (ref 0.1–1.0)
Monocytes Relative: 9 %
Neutro Abs: 3.2 10*3/uL (ref 1.7–7.7)
Neutrophils Relative %: 64 %
Platelets: 204 10*3/uL (ref 150–400)
RBC: 4.32 MIL/uL (ref 3.87–5.11)
RDW: 13.4 % (ref 11.5–15.5)
WBC: 5 10*3/uL (ref 4.0–10.5)
nRBC: 0 % (ref 0.0–0.2)

## 2020-12-27 LAB — COMPREHENSIVE METABOLIC PANEL
ALT: 33 U/L (ref 0–44)
AST: 27 U/L (ref 15–41)
Albumin: 4 g/dL (ref 3.5–5.0)
Alkaline Phosphatase: 125 U/L (ref 38–126)
Anion gap: 9 (ref 5–15)
BUN: 21 mg/dL — ABNORMAL HIGH (ref 6–20)
CO2: 24 mmol/L (ref 22–32)
Calcium: 9.4 mg/dL (ref 8.9–10.3)
Chloride: 104 mmol/L (ref 98–111)
Creatinine, Ser: 1.07 mg/dL — ABNORMAL HIGH (ref 0.44–1.00)
GFR, Estimated: 59 mL/min — ABNORMAL LOW (ref >60)
Glucose, Bld: 107 mg/dL — ABNORMAL HIGH (ref 70–99)
Potassium: 3.6 mmol/L (ref 3.5–5.1)
Sodium: 137 mmol/L (ref 135–145)
Total Bilirubin: 0.5 mg/dL (ref 0.3–1.2)
Total Protein: 7.1 g/dL (ref 6.5–8.1)

## 2020-12-27 LAB — IRON AND TIBC
Iron: 48 ug/dL (ref 28–170)
Saturation Ratios: 13 % (ref 10.4–31.8)
TIBC: 357 ug/dL (ref 250–450)
UIBC: 309 ug/dL

## 2020-12-27 LAB — VITAMIN D 25 HYDROXY (VIT D DEFICIENCY, FRACTURES): Vit D, 25-Hydroxy: 33.02 ng/mL (ref 30–100)

## 2020-12-27 LAB — FERRITIN: Ferritin: 95 ng/mL (ref 11–307)

## 2020-12-27 LAB — VITAMIN B12: Vitamin B-12: 2360 pg/mL — ABNORMAL HIGH (ref 180–914)

## 2020-12-27 MED ORDER — INFLUENZA VAC SPLIT QUAD 0.5 ML IM SUSY
0.5000 mL | PREFILLED_SYRINGE | Freq: Once | INTRAMUSCULAR | Status: AC
  Administered 2020-12-27: 0.5 mL via INTRAMUSCULAR
  Filled 2020-12-27: qty 0.5

## 2020-12-27 MED ORDER — DENOSUMAB 60 MG/ML ~~LOC~~ SOSY
60.0000 mg | PREFILLED_SYRINGE | Freq: Once | SUBCUTANEOUS | Status: AC
  Administered 2020-12-27: 60 mg via SUBCUTANEOUS
  Filled 2020-12-27: qty 1

## 2020-12-27 MED ORDER — INFLUENZA VAC A&B SA ADJ QUAD 0.5 ML IM PRSY: 0.5000 mL | PREFILLED_SYRINGE | Freq: Once | INTRAMUSCULAR | Status: DC

## 2020-12-27 NOTE — Progress Notes (Signed)
Patient taking calcium as directed.  Denied tooth, jaw, and leg pain.  No recent or upcoming dental visits.  Labs reviewed.  Patient tolerated injections with no complaints voiced.  See MAR for details.  Sites clean and dry with no bruising or swelling noted.  Band aids applied.  Vss with discharge and left ambulatory with no s/s of distress noted.  

## 2021-01-15 ENCOUNTER — Other Ambulatory Visit: Payer: Self-pay

## 2021-01-15 ENCOUNTER — Inpatient Hospital Stay (HOSPITAL_COMMUNITY): Payer: BC Managed Care – PPO | Attending: Hematology | Admitting: Hematology

## 2021-01-15 VITALS — BP 115/68 | HR 65 | Temp 97.8°F | Resp 17

## 2021-01-15 DIAGNOSIS — M810 Age-related osteoporosis without current pathological fracture: Secondary | ICD-10-CM | POA: Diagnosis not present

## 2021-01-15 DIAGNOSIS — E611 Iron deficiency: Secondary | ICD-10-CM

## 2021-01-15 DIAGNOSIS — Z8249 Family history of ischemic heart disease and other diseases of the circulatory system: Secondary | ICD-10-CM | POA: Insufficient documentation

## 2021-01-15 DIAGNOSIS — R5383 Other fatigue: Secondary | ICD-10-CM | POA: Diagnosis not present

## 2021-01-15 DIAGNOSIS — Z79899 Other long term (current) drug therapy: Secondary | ICD-10-CM | POA: Diagnosis not present

## 2021-01-15 DIAGNOSIS — Z8269 Family history of other diseases of the musculoskeletal system and connective tissue: Secondary | ICD-10-CM | POA: Diagnosis not present

## 2021-01-15 DIAGNOSIS — R109 Unspecified abdominal pain: Secondary | ICD-10-CM | POA: Diagnosis not present

## 2021-01-15 DIAGNOSIS — E876 Hypokalemia: Secondary | ICD-10-CM | POA: Insufficient documentation

## 2021-01-15 DIAGNOSIS — D509 Iron deficiency anemia, unspecified: Secondary | ICD-10-CM | POA: Insufficient documentation

## 2021-01-15 DIAGNOSIS — F419 Anxiety disorder, unspecified: Secondary | ICD-10-CM | POA: Insufficient documentation

## 2021-01-15 DIAGNOSIS — Z833 Family history of diabetes mellitus: Secondary | ICD-10-CM | POA: Diagnosis not present

## 2021-01-15 DIAGNOSIS — Z87891 Personal history of nicotine dependence: Secondary | ICD-10-CM | POA: Insufficient documentation

## 2021-01-15 DIAGNOSIS — D6861 Antiphospholipid syndrome: Secondary | ICD-10-CM | POA: Diagnosis not present

## 2021-01-15 DIAGNOSIS — Z818 Family history of other mental and behavioral disorders: Secondary | ICD-10-CM | POA: Diagnosis not present

## 2021-01-15 NOTE — Patient Instructions (Signed)
[facility] Discharge Instructions  You were seen today by Dr. Delton Coombes. He went over your recent results. You will be scheduled for 1 iron infusion. Stop taking the iron tablets. Dr. Delton Coombes will see you back in 6 months for labs and follow up.   Thank you for choosing [place] at [facility] to provide your oncology and hematology care.  To afford each patient quality time with our provider, please arrive at least 15 minutes before your scheduled appointment time.   If you have a lab appointment with the [facility] please come in thru the Main Entrance and check in at the main information desk  You need to re-schedule your appointment should you arrive 10 or more minutes late.  We strive to give you quality time with our providers, and arriving late affects you and other patients whose appointments are after yours.  Also, if you no show three or more times for appointments you may be dismissed from the clinic at the providers discretion.     Again, thank you for choosing [facility].  Our hope is that these requests will decrease the amount of time that you wait before being seen by our physicians.       _____________________________________________________________  Should you have questions after your visit to [facility], please contact our office at [phone] between the hours of 8:00 a.m. and 4:30 p.m.  Voicemails left after 4:00 p.m. will not be returned until the following business day.  For prescription refill requests, have your pharmacy contact our office and allow 72 hours.    Cancer Center Support Programs:   > Cancer Support Group  2nd Tuesday of the month 1pm-2pm, Journey Room
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
## 2021-01-17 DIAGNOSIS — D509 Iron deficiency anemia, unspecified: Secondary | ICD-10-CM | POA: Insufficient documentation

## 2021-01-17 NOTE — Progress Notes (Signed)
Intravenous Iron Formulation Change  Ms Cabeza has insurance that requires a change in intravenous iron product from Feraheme to Venofer. Orders have been updated to reflect this change and scheduling message sent to adjust infusion appointments. Dr Delton Coombes notified and agrees with the plan.  Allergies:  Allergies  Allergen Reactions  . Lamictal [Lamotrigine]   . Fosamax [Alendronate Sodium] Nausea And Vomiting    The plan for iron therapy is as follows: Venofer 300 mg IVPB weekly x 3 doses.  Elsie Lincoln, PharmD 01/17/2021

## 2021-01-20 ENCOUNTER — Ambulatory Visit (HOSPITAL_COMMUNITY): Payer: BC Managed Care – PPO

## 2021-01-20 DIAGNOSIS — D509 Iron deficiency anemia, unspecified: Secondary | ICD-10-CM

## 2021-01-24 ENCOUNTER — Inpatient Hospital Stay (HOSPITAL_COMMUNITY): Payer: BC Managed Care – PPO

## 2021-01-24 ENCOUNTER — Encounter (HOSPITAL_COMMUNITY): Payer: Self-pay

## 2021-01-24 ENCOUNTER — Other Ambulatory Visit: Payer: Self-pay

## 2021-01-24 VITALS — BP 110/75 | HR 68 | Temp 97.1°F | Resp 18

## 2021-01-24 DIAGNOSIS — D509 Iron deficiency anemia, unspecified: Secondary | ICD-10-CM

## 2021-01-24 DIAGNOSIS — M810 Age-related osteoporosis without current pathological fracture: Secondary | ICD-10-CM

## 2021-01-24 DIAGNOSIS — E611 Iron deficiency: Secondary | ICD-10-CM

## 2021-01-24 MED ORDER — SODIUM CHLORIDE 0.9 % IV SOLN: Freq: Once | INTRAVENOUS | Status: AC

## 2021-01-24 MED ORDER — SODIUM CHLORIDE 0.9 % IV SOLN
300.0000 mg | Freq: Once | INTRAVENOUS | Status: AC
  Administered 2021-01-24: 300 mg via INTRAVENOUS
  Filled 2021-01-24: qty 15

## 2021-01-24 NOTE — Progress Notes (Signed)
Patient tolerated iron infusion with no complaints voiced.  Peripheral IV site clean and dry with good blood return noted before and after infusion.  Band aid applied.  VSS with discharge and left in satisfactory condition with no s/s of distress noted.   

## 2021-01-31 ENCOUNTER — Inpatient Hospital Stay (HOSPITAL_COMMUNITY): Payer: BC Managed Care – PPO

## 2021-02-10 ENCOUNTER — Other Ambulatory Visit (HOSPITAL_COMMUNITY): Payer: Self-pay

## 2021-02-10 MED ORDER — FUROSEMIDE 20 MG PO TABS: 10.0000 mg | ORAL_TABLET | ORAL | 3 refills | Status: DC | PRN

## 2021-03-31 ENCOUNTER — Other Ambulatory Visit (HOSPITAL_COMMUNITY): Payer: Self-pay | Admitting: Internal Medicine

## 2021-03-31 DIAGNOSIS — Z1231 Encounter for screening mammogram for malignant neoplasm of breast: Secondary | ICD-10-CM

## 2021-04-04 ENCOUNTER — Other Ambulatory Visit: Payer: Self-pay

## 2021-04-04 ENCOUNTER — Ambulatory Visit (HOSPITAL_COMMUNITY)
Admission: RE | Admit: 2021-04-04 | Discharge: 2021-04-04 | Disposition: A | Discharge status: Home / Self Care | Payer: BC Managed Care – PPO | Source: Ambulatory Visit | Attending: Internal Medicine | Admitting: Internal Medicine

## 2021-04-04 DIAGNOSIS — Z1231 Encounter for screening mammogram for malignant neoplasm of breast: Secondary | ICD-10-CM

## 2021-07-18 ENCOUNTER — Other Ambulatory Visit (HOSPITAL_COMMUNITY): Payer: Self-pay | Admitting: Hematology

## 2021-07-23 ENCOUNTER — Other Ambulatory Visit: Payer: Self-pay

## 2021-07-23 ENCOUNTER — Inpatient Hospital Stay (HOSPITAL_COMMUNITY): Payer: BC Managed Care – PPO | Attending: Hematology

## 2021-07-23 DIAGNOSIS — M810 Age-related osteoporosis without current pathological fracture: Secondary | ICD-10-CM | POA: Insufficient documentation

## 2021-07-23 DIAGNOSIS — G479 Sleep disorder, unspecified: Secondary | ICD-10-CM | POA: Diagnosis not present

## 2021-07-23 DIAGNOSIS — D509 Iron deficiency anemia, unspecified: Secondary | ICD-10-CM | POA: Diagnosis not present

## 2021-07-23 DIAGNOSIS — Z87891 Personal history of nicotine dependence: Secondary | ICD-10-CM | POA: Insufficient documentation

## 2021-07-23 DIAGNOSIS — Z8249 Family history of ischemic heart disease and other diseases of the circulatory system: Secondary | ICD-10-CM | POA: Insufficient documentation

## 2021-07-23 DIAGNOSIS — D6861 Antiphospholipid syndrome: Secondary | ICD-10-CM | POA: Diagnosis not present

## 2021-07-23 DIAGNOSIS — Z818 Family history of other mental and behavioral disorders: Secondary | ICD-10-CM | POA: Insufficient documentation

## 2021-07-23 DIAGNOSIS — Z79899 Other long term (current) drug therapy: Secondary | ICD-10-CM | POA: Insufficient documentation

## 2021-07-23 DIAGNOSIS — E876 Hypokalemia: Secondary | ICD-10-CM | POA: Diagnosis not present

## 2021-07-23 DIAGNOSIS — E611 Iron deficiency: Secondary | ICD-10-CM

## 2021-07-23 DIAGNOSIS — Z833 Family history of diabetes mellitus: Secondary | ICD-10-CM | POA: Diagnosis not present

## 2021-07-23 DIAGNOSIS — Z8261 Family history of arthritis: Secondary | ICD-10-CM | POA: Insufficient documentation

## 2021-07-23 LAB — VITAMIN B12: Vitamin B-12: 2719 pg/mL — ABNORMAL HIGH (ref 180–914)

## 2021-07-23 LAB — CBC WITH DIFFERENTIAL/PLATELET
Abs Immature Granulocytes: 0.03 10*3/uL (ref 0.00–0.07)
Basophils Absolute: 0.1 10*3/uL (ref 0.0–0.1)
Basophils Relative: 1 %
Eosinophils Absolute: 0.1 10*3/uL (ref 0.0–0.5)
Eosinophils Relative: 2 %
HCT: 41.9 % (ref 36.0–46.0)
Hemoglobin: 13.3 g/dL (ref 12.0–15.0)
Immature Granulocytes: 1 %
Lymphocytes Relative: 17 %
Lymphs Abs: 1 10*3/uL (ref 0.7–4.0)
MCH: 32.8 pg (ref 26.0–34.0)
MCHC: 31.7 g/dL (ref 30.0–36.0)
MCV: 103.2 fL — ABNORMAL HIGH (ref 80.0–100.0)
Monocytes Absolute: 0.6 10*3/uL (ref 0.1–1.0)
Monocytes Relative: 11 %
Neutro Abs: 4.1 10*3/uL (ref 1.7–7.7)
Neutrophils Relative %: 68 %
Platelets: 189 10*3/uL (ref 150–400)
RBC: 4.06 MIL/uL (ref 3.87–5.11)
RDW: 13.9 % (ref 11.5–15.5)
WBC: 6 10*3/uL (ref 4.0–10.5)
nRBC: 0 % (ref 0.0–0.2)

## 2021-07-23 LAB — IRON AND TIBC
Iron: 64 ug/dL (ref 28–170)
Saturation Ratios: 18 % (ref 10.4–31.8)
TIBC: 362 ug/dL (ref 250–450)
UIBC: 298 ug/dL

## 2021-07-23 LAB — FERRITIN: Ferritin: 104 ng/mL (ref 11–307)

## 2021-07-23 LAB — VITAMIN D 25 HYDROXY (VIT D DEFICIENCY, FRACTURES): Vit D, 25-Hydroxy: 30.8 ng/mL (ref 30–100)

## 2021-07-23 LAB — COMPREHENSIVE METABOLIC PANEL
ALT: 25 U/L (ref 0–44)
AST: 24 U/L (ref 15–41)
Albumin: 4 g/dL (ref 3.5–5.0)
Alkaline Phosphatase: 82 U/L (ref 38–126)
Anion gap: 8 (ref 5–15)
BUN: 21 mg/dL (ref 8–23)
CO2: 22 mmol/L (ref 22–32)
Calcium: 9.3 mg/dL (ref 8.9–10.3)
Chloride: 106 mmol/L (ref 98–111)
Creatinine, Ser: 1.16 mg/dL — ABNORMAL HIGH (ref 0.44–1.00)
GFR, Estimated: 54 mL/min — ABNORMAL LOW (ref >60)
Glucose, Bld: 97 mg/dL (ref 70–99)
Potassium: 4.5 mmol/L (ref 3.5–5.1)
Sodium: 136 mmol/L (ref 135–145)
Total Bilirubin: 0.9 mg/dL (ref 0.3–1.2)
Total Protein: 6.5 g/dL (ref 6.5–8.1)

## 2021-07-30 ENCOUNTER — Other Ambulatory Visit: Payer: Self-pay

## 2021-07-30 ENCOUNTER — Inpatient Hospital Stay (HOSPITAL_BASED_OUTPATIENT_CLINIC_OR_DEPARTMENT_OTHER): Payer: BC Managed Care – PPO | Admitting: Hematology

## 2021-07-30 VITALS — BP 126/84 | HR 73 | Temp 96.9°F | Resp 18 | Wt 185.6 lb

## 2021-07-30 DIAGNOSIS — D509 Iron deficiency anemia, unspecified: Secondary | ICD-10-CM

## 2021-07-30 MED ORDER — FUROSEMIDE 20 MG PO TABS: ORAL_TABLET | ORAL | 1 refills | Status: DC

## 2021-07-30 NOTE — Patient Instructions (Addendum)
[facility] at [facility] Discharge Instructions  You were seen today by Dr. Katragadda. He went over your recent results. Dr. Katragadda will see you back in 6 months for labs and follow up.   Thank you for choosing [facility] at [facility] to provide your oncology and hematology care.  To afford each patient quality time with our provider, please arrive at least 15 minutes before your scheduled appointment time.   If you have a lab appointment with the Cancer Center please come in thru the Main Entrance and check in at the main information desk  You need to re-schedule your appointment should you arrive 10 or more minutes late.  We strive to give you quality time with our providers, and arriving late affects you and other patients whose appointments are after yours.  Also, if you no show three or more times for appointments you may be dismissed from the clinic at the providers discretion.     Again, thank you for choosing [facility].  Our hope is that these requests will decrease the amount of time that you wait before being seen by our physicians.       _____________________________________________________________  Should you have questions after your visit to [facility], please contact our office at [phone] between the hours of 8:00 a.m. and 4:30 p.m.  Voicemails left after 4:00 p.m. will not be returned until the following business day.  For prescription refill requests, have your pharmacy contact our office and allow 72 hours.    Cancer Center Support Programs:   > Cancer Support Group  2nd Tuesday of the month 1pm-2pm, Journey Room    
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
## 2021-07-31 ENCOUNTER — Encounter (HOSPITAL_COMMUNITY): Payer: Self-pay | Admitting: Hematology

## 2021-08-01 ENCOUNTER — Other Ambulatory Visit: Payer: Self-pay

## 2021-08-01 ENCOUNTER — Inpatient Hospital Stay (HOSPITAL_COMMUNITY): Payer: BC Managed Care – PPO

## 2021-08-01 VITALS — BP 111/70 | HR 66 | Temp 98.2°F | Resp 18 | Wt 184.2 lb

## 2021-08-01 DIAGNOSIS — D509 Iron deficiency anemia, unspecified: Secondary | ICD-10-CM

## 2021-08-01 DIAGNOSIS — E611 Iron deficiency: Secondary | ICD-10-CM

## 2021-08-01 DIAGNOSIS — M810 Age-related osteoporosis without current pathological fracture: Secondary | ICD-10-CM

## 2021-08-01 MED ORDER — DENOSUMAB 60 MG/ML ~~LOC~~ SOSY
60.0000 mg | PREFILLED_SYRINGE | Freq: Once | SUBCUTANEOUS | Status: AC
  Administered 2021-08-01: 60 mg via SUBCUTANEOUS
  Filled 2021-08-01: qty 1

## 2021-08-01 NOTE — Progress Notes (Signed)
.  Samantha Fields presents today for injection per the provider's orders. No recent or future dental procedures. Pt states she take Calcium and Vit D as directed. Prolia administration without incident; injection site WNL; see MAR for injection details.  Patient tolerated procedure well and without incident.  No questions or complaints noted at this time.   Prolia given today per MD orders. Tolerated infusion without adverse affects. Vital signs stable. No complaints at this time. Discharged from clinic ambulatory in stable condition. Alert and oriented x 3. F/U with [facility] as scheduled.

## 2021-08-01 NOTE — Patient Instructions (Signed)
[facility]  Discharge Instructions: Thank you for choosing [facility] to provide your oncology and hematology care.  If you have a lab appointment with the [facility], please come in thru the Main Entrance and check in at the main information desk.  Wear comfortable clothing and clothing appropriate for easy access to any Portacath or PICC line.   We strive to give you quality time with your provider. You may need to reschedule your appointment if you arrive late (15 or more minutes).  Arriving late affects you and other patients whose appointments are after yours.  Also, if you miss three or more appointments without notifying the office, you may be dismissed from the clinic at the provider's discretion.      For prescription refill requests, have your pharmacy contact our office and allow 72 hours for refills to be completed.    Today you received Prolia injection.   BELOW ARE SYMPTOMS THAT SHOULD BE REPORTED IMMEDIATELY: *FEVER GREATER THAN 100.4 F (38 C) OR HIGHER *CHILLS OR SWEATING *NAUSEA AND VOMITING THAT IS NOT CONTROLLED WITH YOUR NAUSEA MEDICATION *UNUSUAL SHORTNESS OF BREATH *UNUSUAL BRUISING OR BLEEDING *URINARY PROBLEMS (pain or burning when urinating, or frequent urination) *BOWEL PROBLEMS (unusual diarrhea, constipation, pain near the anus) TENDERNESS IN MOUTH AND THROAT WITH OR WITHOUT PRESENCE OF ULCERS (sore throat, sores in mouth, or a toothache) UNUSUAL RASH, SWELLING OR PAIN  UNUSUAL VAGINAL DISCHARGE OR ITCHING   Items with * indicate a potential emergency and should be followed up as soon as possible or go to the Emergency Department if any problems should occur.  Please show the CHEMOTHERAPY ALERT CARD or IMMUNOTHERAPY ALERT CARD at check-in to the Emergency Department and triage nurse.  Should you have questions after your visit or need to cancel or reschedule your appointment, please contact [facility] [phone]   and follow the prompts.  Office hours are 8:00 a.m. to 4:30 p.m. Monday - Friday. Please note that voicemails left after 4:00 p.m. may not be returned until the following business day.  We are closed weekends and major holidays. You have access to a nurse at all times for urgent questions. Please call the main number to the clinic [phone] and follow the prompts.  For any non-urgent questions, you may also contact your provider using MyChart. We now offer e-Visits for anyone 82 and older to request care online for non-urgent symptoms. For details visit [email or web address].   Also download the MyChart app! Go to the app store, search "MyChart", open the app, select [facility], and log in with your MyChart username and password.  Due to Covid, a mask is required upon entering the hospital/clinic. If you do not have a mask, one will be given to you upon arrival. For doctor visits, patients may have 1 support person aged 53 or older with them. For treatment visits, patients cannot have anyone with them due to current Covid guidelines and our immunocompromised population.

## 2021-08-11 ENCOUNTER — Other Ambulatory Visit (HOSPITAL_COMMUNITY): Payer: Self-pay | Admitting: Internal Medicine

## 2021-08-11 DIAGNOSIS — M810 Age-related osteoporosis without current pathological fracture: Secondary | ICD-10-CM

## 2021-08-13 ENCOUNTER — Encounter: Payer: Self-pay | Admitting: Internal Medicine

## 2021-08-19 ENCOUNTER — Other Ambulatory Visit (HOSPITAL_COMMUNITY): Payer: Self-pay | Admitting: Hematology

## 2021-08-19 DIAGNOSIS — E611 Iron deficiency: Secondary | ICD-10-CM

## 2021-09-26 ENCOUNTER — Ambulatory Visit (HOSPITAL_COMMUNITY)
Admission: RE | Admit: 2021-09-26 | Discharge: 2021-09-26 | Disposition: A | Discharge status: Home / Self Care | Payer: BC Managed Care – PPO | Source: Ambulatory Visit | Attending: Internal Medicine | Admitting: Internal Medicine

## 2021-09-26 ENCOUNTER — Other Ambulatory Visit: Payer: Self-pay

## 2021-09-26 DIAGNOSIS — M810 Age-related osteoporosis without current pathological fracture: Secondary | ICD-10-CM | POA: Insufficient documentation

## 2021-11-21 ENCOUNTER — Other Ambulatory Visit (HOSPITAL_COMMUNITY): Payer: Self-pay | Admitting: Hematology

## 2021-11-21 DIAGNOSIS — D6861 Antiphospholipid syndrome: Secondary | ICD-10-CM

## 2021-11-22 ENCOUNTER — Other Ambulatory Visit: Payer: Self-pay | Admitting: Adult Health

## 2021-11-25 ENCOUNTER — Encounter (HOSPITAL_COMMUNITY): Payer: Self-pay | Admitting: Hematology

## 2021-12-09 ENCOUNTER — Other Ambulatory Visit: Payer: Self-pay | Admitting: Neurology

## 2021-12-09 NOTE — Telephone Encounter (Signed)
Rx refilled. Pt needs appointment for further refills.

## 2021-12-26 ENCOUNTER — Ambulatory Visit: Payer: BC Managed Care – PPO | Admitting: Gastroenterology

## 2021-12-26 ENCOUNTER — Encounter: Payer: Self-pay | Admitting: *Deleted

## 2021-12-26 ENCOUNTER — Other Ambulatory Visit: Payer: Self-pay | Admitting: *Deleted

## 2021-12-26 ENCOUNTER — Encounter: Payer: Self-pay | Admitting: Gastroenterology

## 2021-12-26 ENCOUNTER — Other Ambulatory Visit: Payer: Self-pay

## 2021-12-26 VITALS — BP 115/71 | HR 77 | Temp 97.3°F | Ht 64.0 in | Wt 184.8 lb

## 2021-12-26 DIAGNOSIS — Z1211 Encounter for screening for malignant neoplasm of colon: Secondary | ICD-10-CM | POA: Insufficient documentation

## 2021-12-26 DIAGNOSIS — D6861 Antiphospholipid syndrome: Secondary | ICD-10-CM | POA: Diagnosis not present

## 2021-12-26 MED ORDER — CLENPIQ 10-3.5-12 MG-GM -GM/160ML PO SOLN: 1.0000 | Freq: Once | ORAL | 0 refills | Status: AC

## 2021-12-26 NOTE — Patient Instructions (Signed)
Colonoscopy as planned. You will be given Lovenox instructions prior to your colonoscopy to be able to safely take you off the coumadin prior to your procedure.
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
## 2021-12-28 ENCOUNTER — Encounter: Payer: Self-pay | Admitting: Gastroenterology

## 2021-12-29 ENCOUNTER — Encounter: Payer: Self-pay | Admitting: *Deleted

## 2022-01-01 ENCOUNTER — Telehealth: Payer: Self-pay | Admitting: Gastroenterology

## 2022-01-01 MED ORDER — ENOXAPARIN SODIUM 100 MG/ML IJ SOSY: 1.0000 mg/kg | PREFILLED_SYRINGE | Freq: Two times a day (BID) | INTRAMUSCULAR | 0 refills | Status: DC

## 2022-01-01 NOTE — Telephone Encounter (Signed)
Reached out to Dr. Delton Coombes regarding Lovenox bridge for upcoming colonoscopy. He agreed with plan. Please give lovenox bridge instructions to patient. RX sent to [facility]. She should pick up asap and let us know if any problems getting medication and if she needs any training to give [place] shot.

## 2022-01-01 NOTE — Telephone Encounter (Signed)
Please follow the Medication Instructions below for your procedure:   You need to hold your coumadin four days before your procedure and start a Lovenox bridge.   Your procedure is scheduled for 01/12/22. RX for Lovenox has been sent to your pharmacy. Please pick up asap in case they have to order it, etc.           01/07/22  Last dose of warfarin         01/08/22 No Coumadin/warfarin         01/09/22 No Coumadin/warfarin     Begin Lovenox/enoxaparin [place] 85mg  at 10am and 85mg  at 10pm      01/10/22 No Coumadin/warfarin    Lovenox/enoxaparin [place] 85mg  at10am and 85mg  at 10pm    01/11/22 No Coumadin/warfarin    Lovenox/enoxaparin [place] 85mg  at 10am and 85mg  at 10pm     01/12/22 (procedure date) No Coumadin/warfarin    No Lovenox/enoxaparin      Dr. Abbey Chatters will provide you with further instructions about your coumadin and Lovenox after your procedure.           You can check your INR at home and report values to prescribing provider. Continue Lovenox until your INR is 2-3 for 24 hours.

## 2022-01-02 ENCOUNTER — Encounter: Payer: BC Managed Care – PPO | Admitting: Internal Medicine

## 2022-01-02 NOTE — Telephone Encounter (Signed)
Instructions sent to her mychart and letter mailed.

## 2022-01-06 NOTE — Patient Instructions (Signed)
Samantha Fields  01/06/2022     @PREFPERIOPPHARMACY @   Your procedure is scheduled on  01/12/2022.   Report to [facility] at  1230 P.M.   Call this number if you have problems the morning of surgery:  [phone]   Remember:  Follow the diet and prep instructions given to you by the office.    Your last dose of coumadin should be on 01/07/2022 and your should start your lovenox bridge as directed from office.     Take these medicines the morning of surgery with A SIP OF WATER                                            celexa.     Do not wear jewelry, make-up or nail polish.  Do not wear lotions, powders, or perfumes, or deodorant.  Do not shave 48 hours prior to surgery.  Men may shave face and neck.  Do not bring valuables to the hospital.  [facility] is not responsible for any belongings or valuables.  Contacts, dentures or bridgework may not be worn into surgery.  Leave your suitcase in the car.  After surgery it may be brought to your room.  For patients admitted to the hospital, discharge time will be determined by your treatment team.  Patients discharged the day of surgery will not be allowed to drive home and must have someone with them for 24 hours.    Special instructions:   DO NOT smoke tobacco or vape for 24 hours before your procedure.  Please read over the following fact sheets that you were given. Anesthesia Post-op Instructions and Care and Recovery After Surgery      Colonoscopy, Adult, Care After This sheet gives you information about how to care for yourself after your procedure. Your health care provider may also give you more specific instructions. If you have problems or questions, contact your health care provider. What can I expect after the procedure? After the procedure, it is common to have: A small amount of blood in your stool for 24 hours after the procedure. Some gas. Mild cramping or bloating of your abdomen. Follow these  instructions at home: Eating and drinking  Drink enough fluid to keep your urine pale yellow. Follow instructions from your health care provider about eating or drinking restrictions. Resume your normal diet as instructed by your health care provider. Avoid heavy or fried foods that are hard to digest. Activity Rest as told by your health care provider. Avoid sitting for a long time without moving. Get up to take short walks every 1-2 hours. This is important to improve blood flow and breathing. Ask for help if you feel weak or unsteady. Return to your normal activities as told by your health care provider. Ask your health care provider what activities are safe for you. Managing cramping and bloating  Try walking around when you have cramps or feel bloated. Apply heat to your abdomen as told by your health care provider. Use the heat source that your health care provider recommends, such as a moist heat pack or a heating pad. Place a towel between your skin and the heat source. Leave the heat on for 20-30 minutes. Remove the heat if your skin turns bright red. This is especially important if you are unable to feel pain, heat,  or cold. You may have a greater risk of getting burned. General instructions If you were given a sedative during the procedure, it can affect you for several hours. Do not drive or operate machinery until your health care provider says that it is safe. For the first 24 hours after the procedure: Do not sign important documents. Do not drink alcohol. Do your regular daily activities at a slower pace than normal. Eat soft foods that are easy to digest. Take over-the-counter and prescription medicines only as told by your health care provider. Keep all follow-up visits as told by your health care provider. This is important. Contact a health care provider if: You have blood in your stool 2-3 days after the procedure. Get help right away if you have: More than a small  spotting of blood in your stool. Large blood clots in your stool. Swelling of your abdomen. Nausea or vomiting. A fever. Increasing pain in your abdomen that is not relieved with medicine. Summary After the procedure, it is common to have a small amount of blood in your stool. You may also have mild cramping and bloating of your abdomen. If you were given a sedative during the procedure, it can affect you for several hours. Do not drive or operate machinery until your health care provider says that it is safe. Get help right away if you have a lot of blood in your stool, nausea or vomiting, a fever, or increased pain in your abdomen. This information is not intended to replace advice given to you by your health care provider. Make sure you discuss any questions you have with your health care provider. Document Revised: 10/06/2019 Document Reviewed: 06/26/2019 Elsevier Patient Education  [place] After This sheet gives you information about how to care for yourself after your procedure. Your health care provider may also give you more specific instructions. If you have problems or questions, contact your health care provider. What can I expect after the procedure? After the procedure, it is common to have: Tiredness. Forgetfulness about what happened after the procedure. Impaired judgment for important decisions. Nausea or vomiting. Some difficulty with balance. Follow these instructions at home: For the time period you were told by your health care provider:   Rest as needed. Do not participate in activities where you could fall or become injured. Do not drive or use machinery. Do not drink alcohol. Do not take sleeping pills or medicines that cause drowsiness. Do not make important decisions or sign legal documents. Do not take care of children on your own. Eating and drinking Follow the diet that is recommended by your health care  provider. Drink enough fluid to keep your urine pale yellow. If you vomit: Drink water, juice, or soup when you can drink without vomiting. Make sure you have little or no nausea before eating solid foods. General instructions Have a responsible adult stay with you for the time you are told. It is important to have someone help care for you until you are awake and alert. Take over-the-counter and prescription medicines only as told by your health care provider. If you have sleep apnea, surgery and certain medicines can increase your risk for breathing problems. Follow instructions from your health care provider about wearing your sleep device: Anytime you are sleeping, including during daytime naps. While taking prescription pain medicines, sleeping medicines, or medicines that make you drowsy. Avoid smoking. Keep all follow-up visits as told by your health care provider.  This is important. Contact a health care provider if: You keep feeling nauseous or you keep vomiting. You feel light-headed. You are still sleepy or having trouble with balance after 24 hours. You develop a rash. You have a fever. You have redness or swelling around the IV site. Get help right away if: You have trouble breathing. You have new-onset confusion at home. Summary For several hours after your procedure, you may feel tired. You may also be forgetful and have poor judgment. Have a responsible adult stay with you for the time you are told. It is important to have someone help care for you until you are awake and alert. Rest as told. Do not drive or operate machinery. Do not drink alcohol or take sleeping pills. Get help right away if you have trouble breathing, or if you suddenly become confused. This information is not intended to replace advice given to you by your health care provider. Make sure you discuss any questions you have with your health care provider. Document Revised: 08/15/2020 Document Reviewed:  11/02/2019 Elsevier Patient Education  2022 [employer].

## 2022-01-07 ENCOUNTER — Other Ambulatory Visit: Payer: Self-pay

## 2022-01-07 ENCOUNTER — Encounter (HOSPITAL_COMMUNITY): Payer: BC Managed Care – PPO

## 2022-01-07 ENCOUNTER — Encounter (HOSPITAL_COMMUNITY): Payer: Self-pay

## 2022-01-07 ENCOUNTER — Encounter (HOSPITAL_COMMUNITY)
Admission: RE | Admit: 2022-01-07 | Discharge: 2022-01-07 | Disposition: A | Discharge status: Home / Self Care | Payer: BC Managed Care – PPO | Source: Ambulatory Visit | Attending: Internal Medicine | Admitting: Internal Medicine

## 2022-01-07 VITALS — BP 122/82 | HR 65 | Temp 97.3°F | Resp 18 | Ht 64.0 in | Wt 184.8 lb

## 2022-01-07 DIAGNOSIS — Z01812 Encounter for preprocedural laboratory examination: Secondary | ICD-10-CM | POA: Insufficient documentation

## 2022-01-07 DIAGNOSIS — D509 Iron deficiency anemia, unspecified: Secondary | ICD-10-CM | POA: Diagnosis not present

## 2022-01-07 DIAGNOSIS — E876 Hypokalemia: Secondary | ICD-10-CM | POA: Diagnosis not present

## 2022-01-07 HISTORY — DX: Other specified postprocedural states: Z98.890

## 2022-01-07 HISTORY — DX: Other specified postprocedural states: R11.2

## 2022-01-07 LAB — BASIC METABOLIC PANEL
Anion gap: 8 (ref 5–15)
BUN: 16 mg/dL (ref 8–23)
CO2: 22 mmol/L (ref 22–32)
Calcium: 9.1 mg/dL (ref 8.9–10.3)
Chloride: 106 mmol/L (ref 98–111)
Creatinine, Ser: 1.31 mg/dL — ABNORMAL HIGH (ref 0.44–1.00)
GFR, Estimated: 46 mL/min — ABNORMAL LOW (ref >60)
Glucose, Bld: 86 mg/dL (ref 70–99)
Potassium: 4.1 mmol/L (ref 3.5–5.1)
Sodium: 136 mmol/L (ref 135–145)

## 2022-01-07 LAB — CBC WITH DIFFERENTIAL/PLATELET
Abs Immature Granulocytes: 0.02 10*3/uL (ref 0.00–0.07)
Basophils Absolute: 0.1 10*3/uL (ref 0.0–0.1)
Basophils Relative: 1 %
Eosinophils Absolute: 0.2 10*3/uL (ref 0.0–0.5)
Eosinophils Relative: 3 %
HCT: 43.8 % (ref 36.0–46.0)
Hemoglobin: 13.3 g/dL (ref 12.0–15.0)
Immature Granulocytes: 0 %
Lymphocytes Relative: 22 %
Lymphs Abs: 1.2 10*3/uL (ref 0.7–4.0)
MCH: 30.6 pg (ref 26.0–34.0)
MCHC: 30.4 g/dL (ref 30.0–36.0)
MCV: 100.9 fL — ABNORMAL HIGH (ref 80.0–100.0)
Monocytes Absolute: 0.6 10*3/uL (ref 0.1–1.0)
Monocytes Relative: 11 %
Neutro Abs: 3.5 10*3/uL (ref 1.7–7.7)
Neutrophils Relative %: 63 %
Platelets: 179 10*3/uL (ref 150–400)
RBC: 4.34 MIL/uL (ref 3.87–5.11)
RDW: 14 % (ref 11.5–15.5)
WBC: 5.6 10*3/uL (ref 4.0–10.5)
nRBC: 0 % (ref 0.0–0.2)

## 2022-01-12 ENCOUNTER — Ambulatory Visit (HOSPITAL_COMMUNITY)
Admission: RE | Admit: 2022-01-12 | Discharge: 2022-01-12 | Disposition: A | Discharge status: Home / Self Care | Payer: BC Managed Care – PPO | Attending: Internal Medicine | Admitting: Internal Medicine

## 2022-01-12 ENCOUNTER — Encounter (HOSPITAL_COMMUNITY): Payer: Self-pay

## 2022-01-12 ENCOUNTER — Ambulatory Visit (HOSPITAL_COMMUNITY): Payer: BC Managed Care – PPO | Admitting: Anesthesiology

## 2022-01-12 ENCOUNTER — Other Ambulatory Visit: Payer: Self-pay

## 2022-01-12 ENCOUNTER — Encounter (HOSPITAL_COMMUNITY)
Admission: RE | Disposition: A | Discharge status: Home / Self Care | Payer: Self-pay | Source: Home / Self Care | Attending: Internal Medicine

## 2022-01-12 DIAGNOSIS — K219 Gastro-esophageal reflux disease without esophagitis: Secondary | ICD-10-CM | POA: Diagnosis not present

## 2022-01-12 DIAGNOSIS — D508 Other iron deficiency anemias: Secondary | ICD-10-CM | POA: Insufficient documentation

## 2022-01-12 DIAGNOSIS — D123 Benign neoplasm of transverse colon: Secondary | ICD-10-CM | POA: Insufficient documentation

## 2022-01-12 DIAGNOSIS — D124 Benign neoplasm of descending colon: Secondary | ICD-10-CM | POA: Diagnosis not present

## 2022-01-12 DIAGNOSIS — R197 Diarrhea, unspecified: Secondary | ICD-10-CM | POA: Diagnosis not present

## 2022-01-12 DIAGNOSIS — Z90710 Acquired absence of both cervix and uterus: Secondary | ICD-10-CM | POA: Insufficient documentation

## 2022-01-12 DIAGNOSIS — K635 Polyp of colon: Secondary | ICD-10-CM

## 2022-01-12 DIAGNOSIS — Z7901 Long term (current) use of anticoagulants: Secondary | ICD-10-CM | POA: Insufficient documentation

## 2022-01-12 DIAGNOSIS — K648 Other hemorrhoids: Secondary | ICD-10-CM | POA: Insufficient documentation

## 2022-01-12 DIAGNOSIS — R152 Fecal urgency: Secondary | ICD-10-CM | POA: Diagnosis not present

## 2022-01-12 DIAGNOSIS — Z86718 Personal history of other venous thrombosis and embolism: Secondary | ICD-10-CM | POA: Insufficient documentation

## 2022-01-12 DIAGNOSIS — F419 Anxiety disorder, unspecified: Secondary | ICD-10-CM | POA: Diagnosis not present

## 2022-01-12 DIAGNOSIS — Z86018 Personal history of other benign neoplasm: Secondary | ICD-10-CM | POA: Diagnosis not present

## 2022-01-12 DIAGNOSIS — D6861 Antiphospholipid syndrome: Secondary | ICD-10-CM | POA: Insufficient documentation

## 2022-01-12 DIAGNOSIS — Z1211 Encounter for screening for malignant neoplasm of colon: Secondary | ICD-10-CM | POA: Insufficient documentation

## 2022-01-12 DIAGNOSIS — F17210 Nicotine dependence, cigarettes, uncomplicated: Secondary | ICD-10-CM | POA: Insufficient documentation

## 2022-01-12 HISTORY — PX: POLYPECTOMY: SHX5525

## 2022-01-12 HISTORY — PX: COLONOSCOPY WITH PROPOFOL: SHX5780

## 2022-01-12 HISTORY — PX: BIOPSY: SHX5522

## 2022-01-12 SURGERY — COLONOSCOPY WITH PROPOFOL
Anesthesia: General

## 2022-01-12 MED ORDER — PHENYLEPHRINE 40 MCG/ML (10ML) SYRINGE FOR IV PUSH (FOR BLOOD PRESSURE SUPPORT)
PREFILLED_SYRINGE | INTRAVENOUS | Status: AC
  Filled 2022-01-12: qty 10

## 2022-01-12 MED ORDER — LIDOCAINE HCL (CARDIAC) PF 100 MG/5ML IV SOSY
PREFILLED_SYRINGE | INTRAVENOUS | Status: DC | PRN
Start: 2022-01-12 — End: 2022-01-12
  Administered 2022-01-12: 50 mg via INTRAVENOUS

## 2022-01-12 MED ORDER — LACTATED RINGERS IV SOLN: INTRAVENOUS | Status: DC

## 2022-01-12 MED ORDER — PROPOFOL 10 MG/ML IV BOLUS
INTRAVENOUS | Status: DC | PRN
  Administered 2022-01-12: 20 mg via INTRAVENOUS
  Administered 2022-01-12: 40 mg via INTRAVENOUS
  Administered 2022-01-12: 100 mg via INTRAVENOUS
  Administered 2022-01-12: 40 mg via INTRAVENOUS
  Administered 2022-01-12: 20 mg via INTRAVENOUS

## 2022-01-12 MED ORDER — PHENYLEPHRINE 40 MCG/ML (10ML) SYRINGE FOR IV PUSH (FOR BLOOD PRESSURE SUPPORT)
PREFILLED_SYRINGE | INTRAVENOUS | Status: DC | PRN
  Administered 2022-01-12: 80 ug via INTRAVENOUS
  Administered 2022-01-12: 120 ug via INTRAVENOUS

## 2022-01-12 NOTE — Op Note (Signed)
[facility] Patient Name: Samantha Fields Procedure Date: 01/12/2022 1:30 PM MRN: [id number] Date of Birth: 31-Aug-1961 Attending MD: Elon Alas. Edgar Frisk CSN: [id number] Age: 61 Admit Type: Outpatient Procedure:                Colonoscopy Indications:              Screening for colorectal malignant neoplasm Providers:                Elon Alas. Abbey Chatters, DO, Lambert Mody, Hughie Closs RN, RN, [employer], RN, [employer]. Risa Grill, Technician Referring MD:              Medicines:                See the Anesthesia note for documentation of the                            administered medications Complications:            No immediate complications. Estimated Blood Loss:     Estimated blood loss was minimal. Procedure:                Pre-Anesthesia Assessment:                           - The anesthesia plan was to use monitored                            anesthesia care (MAC).                           After obtaining informed consent, the colonoscope                            was passed under direct vision. Throughout the                            procedure, the patient's blood pressure, pulse, and                            oxygen saturations were monitored continuously. The                            [id number]-HQ190L ([id number]) scope was introduced through                            the anus and advanced to the the cecum, identified                            by appendiceal orifice and ileocecal valve. The                            colonoscopy was performed without  difficulty. The                            patient tolerated the procedure well. The quality                            of the bowel preparation was evaluated using the                            BBPS [facility] Bowel Preparation Scale) with scores                            of: Right Colon = 3, Transverse Colon = 3 and Left                            Colon = 3 (entire  mucosa seen well with no residual                            staining, small fragments of stool or opaque                            liquid). The total BBPS score equals 9. Scope In: 1:42:03 PM Scope Out: 1:58:40 PM Scope Withdrawal Time: 0 hours 12 minutes 5 seconds  Total Procedure Duration: 0 hours 16 minutes 37 seconds  Findings:      The perianal and digital rectal examinations were normal.      Non-bleeding internal hemorrhoids were found during endoscopy.      Two sessile polyps were found in the transverse colon. The polyps were 1       to 2 mm in size. These polyps were removed with a cold biopsy forceps.       Resection and retrieval were complete.      A 6 mm polyp was found in the transverse colon. The polyp was sessile.       The polyp was removed with a cold snare. Resection and retrieval were       complete.      A 4 mm polyp was found in the descending colon. The polyp was sessile.       The polyp was removed with a cold snare. Resection and retrieval were       complete. Impression:               - Non-bleeding internal hemorrhoids.                           - Two 1 to 2 mm polyps in the transverse colon,                            removed with a cold biopsy forceps. Resected and                            retrieved.                           - One 6 mm polyp in the transverse colon, removed  with a cold snare. Resected and retrieved.                           - One 4 mm polyp in the descending colon, removed                            with a cold snare. Resected and retrieved. Moderate Sedation:      Per Anesthesia Care Recommendation:           - Patient has a contact number available for                            emergencies. The signs and symptoms of potential                            delayed complications were discussed with the                            patient. Return to normal activities tomorrow.                             Written discharge instructions were provided to the                            patient.                           - Resume previous diet.                           - Continue present medications.                           - Await pathology results.                           - Repeat colonoscopy in 5 years for surveillance.                           - Return to GI clinic PRN. Procedure Code(s):        --- Professional ---                           [phone], Colonoscopy, flexible; with removal of                            tumor(s), polyp(s), or other lesion(s) by snare                            technique                           45380, 59, Colonoscopy, flexible; with biopsy,                            single or multiple Diagnosis Code(s):        ---  Professional ---                           K63.5, Polyp of colon                           Z12.11, Encounter for screening for malignant                            neoplasm of colon                           K64.8, Other hemorrhoids CPT copyright 2019 American Medical Association. All rights reserved. The codes documented in this report are preliminary and upon coder review may  be revised to meet current compliance requirements. Elon Alas. Abbey Chatters, DO [place] Abbey Chatters, DO 01/12/2022 2:02:32 PM This report has been signed electronically. Number of Addenda: 0

## 2022-01-12 NOTE — Progress Notes (Signed)
Per Dr. Abbey Chatters, advised pt to resume Lovenox today and then take as instructed per Dr. Delton Coombes.  Pt and spouse verbalized understanding.

## 2022-01-12 NOTE — Discharge Instructions (Addendum)
°  Colonoscopy Discharge Instructions  Read the instructions outlined below and refer to this sheet in the next few weeks. These discharge instructions provide you with general information on caring for yourself after you leave the hospital. Your doctor may also give you specific instructions. While your treatment has been planned according to the most current medical practices available, unavoidable complications occasionally occur.   ACTIVITY You may resume your regular activity, but move at a slower pace for the next 24 hours.  Take frequent rest periods for the next 24 hours.  Walking will help get rid of the air and reduce the bloated feeling in your belly (abdomen).  No driving for 24 hours (because of the medicine (anesthesia) used during the test).   Do not sign any important legal documents or operate any machinery for 24 hours (because of the anesthesia used during the test).  NUTRITION Drink plenty of fluids.  You may resume your normal diet as instructed by your doctor.  Begin with a light meal and progress to your normal diet. Heavy or fried foods are harder to digest and may make you feel sick to your stomach (nauseated).  Avoid alcoholic beverages for 24 hours or as instructed.  MEDICATIONS You may resume your normal medications unless your doctor tells you otherwise.  WHAT YOU CAN EXPECT TODAY Some feelings of bloating in the abdomen.  Passage of more gas than usual.  Spotting of blood in your stool or on the toilet paper.  IF YOU HAD POLYPS REMOVED DURING THE COLONOSCOPY: No aspirin products for 7 days or as instructed.  No alcohol for 7 days or as instructed.  Eat a soft diet for the next 24 hours.  FINDING OUT THE RESULTS OF YOUR TEST Not all test results are available during your visit. If your test results are not back during the visit, make an appointment with your caregiver to find out the results. Do not assume everything is normal if you have not heard from your  caregiver or the medical facility. It is important for you to follow up on all of your test results.  SEEK IMMEDIATE MEDICAL ATTENTION IF: You have more than a spotting of blood in your stool.  Your belly is swollen (abdominal distention).  You are nauseated or vomiting.  You have a temperature over 101.  You have abdominal pain or discomfort that is severe or gets worse throughout the day.   Your colonoscopy revealed 4 polyp(s) which I removed successfully. Await pathology results, my office will contact you. I recommend repeating colonoscopy in 5 years for surveillance purposes. Otherwise follow up with GI as needed.    I hope you have a great rest of your week!  Charles K. Carver, D.O. Gastroenterology and Hepatology [facility]  

## 2022-01-12 NOTE — Interval H&P Note (Signed)
History and Physical Interval Note:  01/12/2022 1:30 PM  Samantha Fields  has presented today for surgery, with the diagnosis of screening.  The various methods of treatment have been discussed with the patient and family. After consideration of risks, benefits and other options for treatment, the patient has consented to  Procedure(s) with comments: COLONOSCOPY WITH PROPOFOL (N/A) - 2:30pm as a surgical intervention.  The patient's history has been reviewed, patient examined, no change in status, stable for surgery.  I have reviewed the patient's chart and labs.  Questions were answered to the patient's satisfaction.     [name]

## 2022-01-12 NOTE — Anesthesia Procedure Notes (Signed)
Date/Time: 01/12/2022 1:41 PM Performed by: Orlie Dakin, CRNA Pre-anesthesia Checklist: Patient identified, Emergency Drugs available, Suction available and Patient being monitored Patient Re-evaluated:Patient Re-evaluated prior to induction Oxygen Delivery Method: Nasal cannula Induction Type: IV induction Placement Confirmation: positive ETCO2

## 2022-01-12 NOTE — Interval H&P Note (Signed)
History and Physical Interval Note:  01/12/2022 1:31 PM  Samantha Fields  has presented today for surgery, with the diagnosis of screening.  The various methods of treatment have been discussed with the patient and family. After consideration of risks, benefits and other options for treatment, the patient has consented to  Procedure(s) with comments: COLONOSCOPY WITH PROPOFOL (N/A) - 2:30pm as a surgical intervention.  The patient's history has been reviewed, patient examined, no change in status, stable for surgery.  I have reviewed the patient's chart and labs.  Questions were answered to the patient's satisfaction.     [name]

## 2022-01-12 NOTE — Anesthesia Postprocedure Evaluation (Signed)
Anesthesia Post Note  Patient: Samantha Fields  Procedure(s) Performed: COLONOSCOPY WITH PROPOFOL BIOPSY POLYPECTOMY  Patient location during evaluation: Phase II Anesthesia Type: General Level of consciousness: awake and alert and oriented Pain management: pain level controlled Vital Signs Assessment: post-procedure vital signs reviewed and stable Respiratory status: spontaneous breathing, nonlabored ventilation and respiratory function stable Cardiovascular status: blood pressure returned to baseline and stable Postop Assessment: no apparent nausea or vomiting Anesthetic complications: no   No notable events documented.   Last Vitals:  Vitals:   01/12/22 1308 01/12/22 1401  BP: 109/75 96/67  Resp: 13 10  Temp: 36.9 C (!) 36.4 C  SpO2: 100% 98%    Last Pain:  Vitals:   01/12/22 1401  TempSrc: Axillary  PainSc: 0-No pain                 Adlynn Lowenstein C Vane Yapp

## 2022-01-12 NOTE — Anesthesia Preprocedure Evaluation (Signed)
Anesthesia Evaluation  Patient identified by MRN, date of birth, ID band Patient awake    Reviewed: Allergy & Precautions, NPO status , Patient's Chart, lab work & pertinent test results  History of Anesthesia Complications (+) PONV and history of anesthetic complications  Airway Mallampati: II  TM Distance: >3 FB Neck ROM: Full   Comment: Cervical herniated disc Dental  (+) Dental Advisory Given   Pulmonary Current Smoker and Patient abstained from smoking.,    Pulmonary exam normal breath sounds clear to auscultation       Cardiovascular Exercise Tolerance: Good negative cardio ROS Normal cardiovascular exam Rhythm:Regular Rate:Normal  Ostium secundum type atrial septal defect   Neuro/Psych Seizures -, Well Controlled,  PSYCHIATRIC DISORDERS Anxiety  Neuromuscular disease (RLS, Cervical herniated disc)    GI/Hepatic GERD  Controlled,  Endo/Other    Renal/GU Renal InsufficiencyRenal disease     Musculoskeletal negative musculoskeletal ROS (+)   Abdominal   Peds  Hematology  (+) Blood dyscrasia (hypercoagulable state, Antiphospholipid antibody syndrome,Last dose of Lovenox - 0/29/23 8pm ), anemia ,   Anesthesia Other Findings Last dose of Lovenox - 0/29/23 8pm  Reproductive/Obstetrics negative OB ROS                            Anesthesia Physical Anesthesia Plan  ASA: 2  Anesthesia Plan: General   Post-op Pain Management: Minimal or no pain anticipated   Induction: Intravenous  PONV Risk Score and Plan: TIVA  Airway Management Planned: Nasal Cannula and Natural Airway  Additional Equipment:   Intra-op Plan:   Post-operative Plan:   Informed Consent: I have reviewed the patients History and Physical, chart, labs and discussed the procedure including the risks, benefits and alternatives for the proposed anesthesia with the patient or authorized representative who has indicated  his/her understanding and acceptance.     Dental advisory given  Plan Discussed with: CRNA and Surgeon  Anesthesia Plan Comments:        Anesthesia Quick Evaluation

## 2022-01-12 NOTE — Transfer of Care (Signed)
Immediate Anesthesia Transfer of Care Note  Patient: Samantha Fields  Procedure(s) Performed: COLONOSCOPY WITH PROPOFOL BIOPSY POLYPECTOMY  Patient Location: PACU  Anesthesia Type:General  Level of Consciousness: awake  Airway & Oxygen Therapy: Patient Spontanous Breathing  Post-op Assessment: Report given to RN, Post -op Vital signs reviewed and stable and Patient moving all extremities X 4  Post vital signs: Reviewed and stable  Last Vitals:  Vitals Value Taken Time  BP    Temp    Pulse    Resp    SpO2      Last Pain:  Vitals:   01/12/22 1338  TempSrc:   PainSc: 0-No pain      Patients Stated Pain Goal: 7 (22/97/98 9211)  Complications: No notable events documented.

## 2022-01-14 LAB — SURGICAL PATHOLOGY

## 2022-01-15 ENCOUNTER — Encounter (HOSPITAL_COMMUNITY): Payer: Self-pay | Admitting: Internal Medicine

## 2022-02-02 ENCOUNTER — Other Ambulatory Visit: Payer: Self-pay

## 2022-02-02 ENCOUNTER — Inpatient Hospital Stay (HOSPITAL_COMMUNITY): Payer: BC Managed Care – PPO | Attending: Hematology

## 2022-02-02 ENCOUNTER — Inpatient Hospital Stay (HOSPITAL_COMMUNITY): Payer: BC Managed Care – PPO

## 2022-02-02 VITALS — BP 112/73 | HR 58 | Temp 97.8°F | Resp 17 | Ht 64.0 in | Wt 175.1 lb

## 2022-02-02 DIAGNOSIS — E611 Iron deficiency: Secondary | ICD-10-CM

## 2022-02-02 DIAGNOSIS — D509 Iron deficiency anemia, unspecified: Secondary | ICD-10-CM

## 2022-02-02 DIAGNOSIS — M810 Age-related osteoporosis without current pathological fracture: Secondary | ICD-10-CM | POA: Insufficient documentation

## 2022-02-02 DIAGNOSIS — Z79899 Other long term (current) drug therapy: Secondary | ICD-10-CM | POA: Diagnosis not present

## 2022-02-02 LAB — CBC WITH DIFFERENTIAL/PLATELET
Abs Immature Granulocytes: 0.02 10*3/uL (ref 0.00–0.07)
Basophils Absolute: 0.1 10*3/uL (ref 0.0–0.1)
Basophils Relative: 1 %
Eosinophils Absolute: 0.2 10*3/uL (ref 0.0–0.5)
Eosinophils Relative: 2 %
HCT: 45.3 % (ref 36.0–46.0)
Hemoglobin: 14.4 g/dL (ref 12.0–15.0)
Immature Granulocytes: 0 %
Lymphocytes Relative: 17 %
Lymphs Abs: 1.1 10*3/uL (ref 0.7–4.0)
MCH: 31.7 pg (ref 26.0–34.0)
MCHC: 31.8 g/dL (ref 30.0–36.0)
MCV: 99.8 fL (ref 80.0–100.0)
Monocytes Absolute: 0.6 10*3/uL (ref 0.1–1.0)
Monocytes Relative: 10 %
Neutro Abs: 4.5 10*3/uL (ref 1.7–7.7)
Neutrophils Relative %: 70 %
Platelets: 198 10*3/uL (ref 150–400)
RBC: 4.54 MIL/uL (ref 3.87–5.11)
RDW: 14.1 % (ref 11.5–15.5)
WBC: 6.5 10*3/uL (ref 4.0–10.5)
nRBC: 0 % (ref 0.0–0.2)

## 2022-02-02 LAB — COMPREHENSIVE METABOLIC PANEL
ALT: 40 U/L (ref 0–44)
AST: 34 U/L (ref 15–41)
Albumin: 4.4 g/dL (ref 3.5–5.0)
Alkaline Phosphatase: 63 U/L (ref 38–126)
Anion gap: 8 (ref 5–15)
BUN: 28 mg/dL — ABNORMAL HIGH (ref 8–23)
CO2: 24 mmol/L (ref 22–32)
Calcium: 9.8 mg/dL (ref 8.9–10.3)
Chloride: 104 mmol/L (ref 98–111)
Creatinine, Ser: 1.17 mg/dL — ABNORMAL HIGH (ref 0.44–1.00)
GFR, Estimated: 53 mL/min — ABNORMAL LOW (ref >60)
Glucose, Bld: 97 mg/dL (ref 70–99)
Potassium: 4.4 mmol/L (ref 3.5–5.1)
Sodium: 136 mmol/L (ref 135–145)
Total Bilirubin: 0.3 mg/dL (ref 0.3–1.2)
Total Protein: 7.2 g/dL (ref 6.5–8.1)

## 2022-02-02 LAB — IRON AND TIBC
Iron: 55 ug/dL (ref 28–170)
Saturation Ratios: 15 % (ref 10.4–31.8)
TIBC: 379 ug/dL (ref 250–450)
UIBC: 324 ug/dL

## 2022-02-02 LAB — VITAMIN B12: Vitamin B-12: 2260 pg/mL — ABNORMAL HIGH (ref 180–914)

## 2022-02-02 LAB — FERRITIN: Ferritin: 88 ng/mL (ref 11–307)

## 2022-02-02 MED ORDER — SODIUM CHLORIDE 0.9 % IV SOLN: Freq: Once | INTRAVENOUS | Status: DC

## 2022-02-02 MED ORDER — DENOSUMAB 60 MG/ML ~~LOC~~ SOSY
60.0000 mg | PREFILLED_SYRINGE | Freq: Once | SUBCUTANEOUS | Status: AC
  Administered 2022-02-02: 60 mg via SUBCUTANEOUS
  Filled 2022-02-02: qty 1

## 2022-02-02 NOTE — Progress Notes (Signed)
Patient presents today for Prolia injection.  Patient is in satisfactory condition with no complaints voiced.  Vital signs are stable.  Labs reviewed and all labs are within treatment parameters.  We will proceed with treatment per MD orders.   Patient tolerated Prolia injection with no complaints voiced.  Site clean and dry with no bruising or swelling noted.  No complaints of pain.  Discharged with vital signs stable and no signs or symptoms of distress noted.

## 2022-02-02 NOTE — Patient Instructions (Signed)
[facility]  Discharge Instructions: Thank you for choosing [facility] to provide your oncology and hematology care.  If you have a lab appointment with the Cancer Center, please come in thru the Main Entrance and check in at the main information desk.  Wear comfortable clothing and clothing appropriate for easy access to any Portacath or PICC line.   We strive to give you quality time with your provider. You may need to reschedule your appointment if you arrive late (15 or more minutes).  Arriving late affects you and other patients whose appointments are after yours.  Also, if you miss three or more appointments without notifying the office, you may be dismissed from the clinic at the provider's discretion.      For prescription refill requests, have your pharmacy contact our office and allow 72 hours for refills to be completed.        To help prevent nausea and vomiting after your treatment, we encourage you to take your nausea medication as directed.  BELOW ARE SYMPTOMS THAT SHOULD BE REPORTED IMMEDIATELY: *FEVER GREATER THAN 100.4 F (38 C) OR HIGHER *CHILLS OR SWEATING *NAUSEA AND VOMITING THAT IS NOT CONTROLLED WITH YOUR NAUSEA MEDICATION *UNUSUAL SHORTNESS OF BREATH *UNUSUAL BRUISING OR BLEEDING *URINARY PROBLEMS (pain or burning when urinating, or frequent urination) *BOWEL PROBLEMS (unusual diarrhea, constipation, pain near the anus) TENDERNESS IN MOUTH AND THROAT WITH OR WITHOUT PRESENCE OF ULCERS (sore throat, sores in mouth, or a toothache) UNUSUAL RASH, SWELLING OR PAIN  UNUSUAL VAGINAL DISCHARGE OR ITCHING   Items with * indicate a potential emergency and should be followed up as soon as possible or go to the Emergency Department if any problems should occur.  Please show the CHEMOTHERAPY ALERT CARD or IMMUNOTHERAPY ALERT CARD at check-in to the Emergency Department and triage nurse.  Should you have questions after your visit or need to cancel  or reschedule your appointment, please contact [facility] [phone]  and follow the prompts.  Office hours are 8:00 a.m. to 4:30 p.m. Monday - Friday. Please note that voicemails left after 4:00 p.m. may not be returned until the following business day.  We are closed weekends and major holidays. You have access to a nurse at all times for urgent questions. Please call the main number to the clinic [phone] and follow the prompts.  For any non-urgent questions, you may also contact your provider using MyChart. We now offer e-Visits for anyone 18 and older to request care online for non-urgent symptoms. For details visit [email or web address].   Also download the MyChart app! Go to the app store, search "MyChart", open the app, select [facility], and log in with your MyChart username and password.  Due to Covid, a mask is required upon entering the hospital/clinic. If you do not have a mask, one will be given to you upon arrival. For doctor visits, patients may have 1 support person aged 18 or older with them. For treatment visits, patients cannot have anyone with them due to current Covid guidelines and our immunocompromised population.  

## 2022-02-05 ENCOUNTER — Ambulatory Visit (HOSPITAL_COMMUNITY): Payer: BC Managed Care – PPO | Admitting: Physician Assistant

## 2022-02-06 ENCOUNTER — Ambulatory Visit (HOSPITAL_COMMUNITY): Payer: BC Managed Care – PPO | Admitting: Physician Assistant
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
## 2022-02-11 ENCOUNTER — Other Ambulatory Visit: Payer: Self-pay

## 2022-02-11 ENCOUNTER — Inpatient Hospital Stay (HOSPITAL_COMMUNITY): Payer: BC Managed Care – PPO | Attending: Physician Assistant | Admitting: Physician Assistant

## 2022-02-11 VITALS — BP 111/77 | HR 76 | Temp 97.1°F | Resp 18 | Ht 64.0 in | Wt 173.9 lb

## 2022-02-11 DIAGNOSIS — Z833 Family history of diabetes mellitus: Secondary | ICD-10-CM | POA: Insufficient documentation

## 2022-02-11 DIAGNOSIS — D509 Iron deficiency anemia, unspecified: Secondary | ICD-10-CM | POA: Diagnosis not present

## 2022-02-11 DIAGNOSIS — Z79899 Other long term (current) drug therapy: Secondary | ICD-10-CM | POA: Insufficient documentation

## 2022-02-11 DIAGNOSIS — F17210 Nicotine dependence, cigarettes, uncomplicated: Secondary | ICD-10-CM | POA: Diagnosis not present

## 2022-02-11 DIAGNOSIS — Z818 Family history of other mental and behavioral disorders: Secondary | ICD-10-CM | POA: Diagnosis not present

## 2022-02-11 DIAGNOSIS — E538 Deficiency of other specified B group vitamins: Secondary | ICD-10-CM

## 2022-02-11 DIAGNOSIS — D6861 Antiphospholipid syndrome: Secondary | ICD-10-CM | POA: Insufficient documentation

## 2022-02-11 DIAGNOSIS — R5383 Other fatigue: Secondary | ICD-10-CM | POA: Insufficient documentation

## 2022-02-11 DIAGNOSIS — E611 Iron deficiency: Secondary | ICD-10-CM | POA: Diagnosis present

## 2022-02-11 DIAGNOSIS — Z8249 Family history of ischemic heart disease and other diseases of the circulatory system: Secondary | ICD-10-CM | POA: Insufficient documentation

## 2022-02-11 DIAGNOSIS — E876 Hypokalemia: Secondary | ICD-10-CM | POA: Insufficient documentation

## 2022-02-11 DIAGNOSIS — M810 Age-related osteoporosis without current pathological fracture: Secondary | ICD-10-CM | POA: Diagnosis not present

## 2022-02-11 DIAGNOSIS — Z8261 Family history of arthritis: Secondary | ICD-10-CM | POA: Diagnosis not present

## 2022-02-11 MED ORDER — WARFARIN SODIUM 3 MG PO TABS: 3.0000 mg | ORAL_TABLET | Freq: Every day | ORAL | 6 refills | Status: DC

## 2022-02-11 NOTE — Patient Instructions (Signed)
[place] at [facility] ?Discharge Instructions ? ?You were seen today by Tarri Abernethy PA-C for your antiphospholipid syndrome with history of multiple DVTs, iron deficiency, and osteoporosis.   ? ?ANTIPHOSPHOLIPID SYNDROME: Continue Coumadin.  Refill prescription has been sent to pharmacy for Coumadin 3 mg nightly.  Continue INR checks with Dr. Nevada Crane ? ?IRON DEFICIENCY: We will schedule you for IV iron (Venofer) x3 doses. ? ?OSTEOPOROSIS: Continue Prolia injections every 6 months. ? ?B12 DEFICIENCY: Your B12 level is actually elevated.  You can STOP taking the B12 supplement at this time.  We will recheck in 6 months. ? ?LOW POTASSIUM: Continue potassium supplement. ? ?FOLLOW-UP APPOINTMENT: Repeat labs and office visit in 6 months. ? ? ?Thank you for choosing [place] at [facility] to provide your oncology and hematology care.  To afford each patient quality time with our provider, please arrive at least 15 minutes before your scheduled appointment time.  ? ?If you have a lab appointment with the [facility] please come in thru the Main Entrance and check in at the main information desk. ? ?You need to re-schedule your appointment should you arrive 10 or more minutes late.  We strive to give you quality time with our providers, and arriving late affects you and other patients whose appointments are after yours.  Also, if you no show three or more times for appointments you may be dismissed from the clinic at the providers discretion.     ?Again, thank you for choosing [facility].  Our hope is that these requests will decrease the amount of time that you wait before being seen by our physicians.       ?_____________________________________________________________ ? ?Should you have questions after your visit to [facility], please contact our office at [phone] and follow the prompts.  Our office hours are 8:00 a.m. and 4:30  p.m. Monday - Friday.  Please note that voicemails left after 4:00 p.m. may not be returned until the following business day.  We are closed weekends and major holidays.  You do have access to a nurse 24-7, just call the main number to the clinic [phone] and do not press any options, hold on the line and a nurse will answer the phone.   ? ?For prescription refill requests, have your pharmacy contact our office and allow 72 hours.   ? ?Due to Covid, you will need to wear a mask upon entering the hospital. If you do not have a mask, a mask will be given to you at the Main Entrance upon arrival. For doctor visits, patients may have 1 support person age 48 or older with them. For treatment visits, patients can not have anyone with them due to social distancing guidelines and our immunocompromised population.  ? ? ? ?

## 2022-02-12 ENCOUNTER — Other Ambulatory Visit: Payer: Self-pay | Admitting: Neurology

## 2022-02-20 ENCOUNTER — Encounter (HOSPITAL_COMMUNITY): Payer: Self-pay

## 2022-02-20 ENCOUNTER — Inpatient Hospital Stay (HOSPITAL_COMMUNITY): Payer: BC Managed Care – PPO

## 2022-02-20 ENCOUNTER — Other Ambulatory Visit: Payer: Self-pay

## 2022-02-20 VITALS — BP 102/62 | HR 62 | Temp 97.6°F | Resp 18

## 2022-02-20 DIAGNOSIS — M810 Age-related osteoporosis without current pathological fracture: Secondary | ICD-10-CM

## 2022-02-20 DIAGNOSIS — E611 Iron deficiency: Secondary | ICD-10-CM

## 2022-02-20 DIAGNOSIS — D6861 Antiphospholipid syndrome: Secondary | ICD-10-CM | POA: Diagnosis not present

## 2022-02-20 DIAGNOSIS — D509 Iron deficiency anemia, unspecified: Secondary | ICD-10-CM

## 2022-02-20 MED ORDER — SODIUM CHLORIDE 0.9 % IV SOLN
300.0000 mg | Freq: Once | INTRAVENOUS | Status: AC
  Administered 2022-02-20: 300 mg via INTRAVENOUS
  Filled 2022-02-20: qty 300

## 2022-02-20 MED ORDER — LORATADINE 10 MG PO TABS
10.0000 mg | ORAL_TABLET | Freq: Once | ORAL | Status: AC
  Administered 2022-02-20: 10 mg via ORAL
  Filled 2022-02-20: qty 1

## 2022-02-20 MED ORDER — ACETAMINOPHEN 325 MG PO TABS
650.0000 mg | ORAL_TABLET | Freq: Once | ORAL | Status: AC
  Administered 2022-02-20: 650 mg via ORAL
  Filled 2022-02-20: qty 2

## 2022-02-20 MED ORDER — SODIUM CHLORIDE 0.9 % IV SOLN: Freq: Once | INTRAVENOUS | Status: AC

## 2022-02-20 NOTE — Patient Instructions (Signed)
[facility]  Discharge Instructions: °Thank you for choosing [facility] to provide your oncology and hematology care.  °If you have a lab appointment with the Cancer Center, please come in thru the Main Entrance and check in at the main information desk. ° °Wear comfortable clothing and clothing appropriate for easy access to any Portacath or PICC line.  ° °We strive to give you quality time with your provider. You may need to reschedule your appointment if you arrive late (15 or more minutes).  Arriving late affects you and other patients whose appointments are after yours.  Also, if you miss three or more appointments without notifying the office, you may be dismissed from the clinic at the provider’s discretion.    °  °For prescription refill requests, have your pharmacy contact our office and allow 72 hours for refills to be completed.   ° °Today you received the following : Venofer 300 mg.     °  °To help prevent nausea and vomiting after your treatment, we encourage you to take your nausea medication as directed. ° °BELOW ARE SYMPTOMS THAT SHOULD BE REPORTED IMMEDIATELY: °*FEVER GREATER THAN 100.4 F (38 °C) OR HIGHER °*CHILLS OR SWEATING °*NAUSEA AND VOMITING THAT IS NOT CONTROLLED WITH YOUR NAUSEA MEDICATION °*UNUSUAL SHORTNESS OF BREATH °*UNUSUAL BRUISING OR BLEEDING °*URINARY PROBLEMS (pain or burning when urinating, or frequent urination) °*BOWEL PROBLEMS (unusual diarrhea, constipation, pain near the anus) °TENDERNESS IN MOUTH AND THROAT WITH OR WITHOUT PRESENCE OF ULCERS (sore throat, sores in mouth, or a toothache) °UNUSUAL RASH, SWELLING OR PAIN  °UNUSUAL VAGINAL DISCHARGE OR ITCHING  ° °Items with * indicate a potential emergency and should be followed up as soon as possible or go to the Emergency Department if any problems should occur. ° °Please show the CHEMOTHERAPY ALERT CARD or IMMUNOTHERAPY ALERT CARD at check-in to the Emergency Department and triage nurse. ° °Should  you have questions after your visit or need to cancel or reschedule your appointment, please contact [facility] [phone]  and follow the prompts.  Office hours are 8:00 a.m. to 4:30 p.m. Monday - Friday. Please note that voicemails left after 4:00 p.m. may not be returned until the following business day.  We are closed weekends and major holidays. You have access to a nurse at all times for urgent questions. Please call the main number to the clinic [phone] and follow the prompts. ° °For any non-urgent questions, you may also contact your provider using MyChart. We now offer e-Visits for anyone 18 and older to request care online for non-urgent symptoms. For details visit [email or web address]. °  °Also download the MyChart app! Go to the app store, search "MyChart", open the app, select [facility], and log in with your MyChart username and password. ° °Due to Covid, a mask is required upon entering the hospital/clinic. If you do not have a mask, one will be given to you upon arrival. For doctor visits, patients may have 1 support person aged 18 or older with them. For treatment visits, patients cannot have anyone with them due to current Covid guidelines and our immunocompromised population.  °

## 2022-02-20 NOTE — Progress Notes (Signed)
Patient presents today for Venofer 300 mgs. Patient has no complaints of changes since her last visit. MAR reviewed and updated.  ? ?Venofer 300 mg given today per MD orders. Tolerated infusion without adverse affects. Vital signs stable. No complaints at this time. Discharged from clinic ambulatory in stable condition. Alert and oriented x 3. F/U with [facility] as scheduled.   ?

## 2022-02-27 ENCOUNTER — Inpatient Hospital Stay (HOSPITAL_COMMUNITY): Payer: BC Managed Care – PPO

## 2022-02-27 ENCOUNTER — Other Ambulatory Visit: Payer: Self-pay

## 2022-02-27 VITALS — BP 99/66 | HR 63 | Temp 98.0°F | Resp 18

## 2022-02-27 DIAGNOSIS — E611 Iron deficiency: Secondary | ICD-10-CM

## 2022-02-27 DIAGNOSIS — D509 Iron deficiency anemia, unspecified: Secondary | ICD-10-CM

## 2022-02-27 DIAGNOSIS — D6861 Antiphospholipid syndrome: Secondary | ICD-10-CM | POA: Diagnosis not present

## 2022-02-27 DIAGNOSIS — M810 Age-related osteoporosis without current pathological fracture: Secondary | ICD-10-CM

## 2022-02-27 MED ORDER — SODIUM CHLORIDE 0.9 % IV SOLN: Freq: Once | INTRAVENOUS | Status: AC

## 2022-02-27 MED ORDER — SODIUM CHLORIDE 0.9 % IV SOLN
300.0000 mg | Freq: Once | INTRAVENOUS | Status: AC
  Administered 2022-02-27: 300 mg via INTRAVENOUS
  Filled 2022-02-27: qty 300

## 2022-02-27 MED ORDER — LORATADINE 10 MG PO TABS
10.0000 mg | ORAL_TABLET | Freq: Once | ORAL | Status: AC
  Administered 2022-02-27: 10 mg via ORAL
  Filled 2022-02-27: qty 1

## 2022-02-27 MED ORDER — ACETAMINOPHEN 325 MG PO TABS
650.0000 mg | ORAL_TABLET | Freq: Once | ORAL | Status: AC
  Administered 2022-02-27: 650 mg via ORAL
  Filled 2022-02-27: qty 2

## 2022-02-27 NOTE — Patient Instructions (Signed)
[facility]  Discharge Instructions: ?Thank you for choosing [facility] to provide your oncology and hematology care.  ?If you have a lab appointment with the [facility], please come in thru the Main Entrance and check in at the main information desk. ? ?Wear comfortable clothing and clothing appropriate for easy access to any Portacath or PICC line.  ? ?We strive to give you quality time with your provider. You may need to reschedule your appointment if you arrive late (15 or more minutes).  Arriving late affects you and other patients whose appointments are after yours.  Also, if you miss three or more appointments without notifying the office, you may be dismissed from the clinic at the provider?s discretion.    ?  ?For prescription refill requests, have your pharmacy contact our office and allow 72 hours for refills to be completed.   ? ?Today you received the following chemotherapy and/or immunotherapy agents Venofer IV iron. ?  ? ?BELOW ARE SYMPTOMS THAT SHOULD BE REPORTED IMMEDIATELY: ?*FEVER GREATER THAN 100.4 F (38 ?C) OR HIGHER ?*CHILLS OR SWEATING ?*NAUSEA AND VOMITING THAT IS NOT CONTROLLED WITH YOUR NAUSEA MEDICATION ?*UNUSUAL SHORTNESS OF BREATH ?*UNUSUAL BRUISING OR BLEEDING ?*URINARY PROBLEMS (pain or burning when urinating, or frequent urination) ?*BOWEL PROBLEMS (unusual diarrhea, constipation, pain near the anus) ?TENDERNESS IN MOUTH AND THROAT WITH OR WITHOUT PRESENCE OF ULCERS (sore throat, sores in mouth, or a toothache) ?UNUSUAL RASH, SWELLING OR PAIN  ?UNUSUAL VAGINAL DISCHARGE OR ITCHING  ? ?Items with * indicate a potential emergency and should be followed up as soon as possible or go to the Emergency Department if any problems should occur. ? ?Please show the CHEMOTHERAPY ALERT CARD or IMMUNOTHERAPY ALERT CARD at check-in to the Emergency Department and triage nurse. ? ?Should you have questions after your visit or need to cancel or reschedule your  appointment, please contact [facility] [phone]  and follow the prompts.  Office hours are 8:00 a.m. to 4:30 p.m. Monday - Friday. Please note that voicemails left after 4:00 p.m. may not be returned until the following business day.  We are closed weekends and major holidays. You have access to a nurse at all times for urgent questions. Please call the main number to the clinic [phone] and follow the prompts. ? ?For any non-urgent questions, you may also contact your provider using MyChart. We now offer e-Visits for anyone 15 and older to request care online for non-urgent symptoms. For details visit [email or web address]. ?  ?Also download the MyChart app! Go to the app store, search "MyChart", open the app, select [facility], and log in with your MyChart username and password. ? ?Due to Covid, a mask is required upon entering the hospital/clinic. If you do not have a mask, one will be given to you upon arrival. For doctor visits, patients may have 1 support person aged 46 or older with them. For treatment visits, patients cannot have anyone with them due to current Covid guidelines and our immunocompromised population.  ?

## 2022-02-27 NOTE — Progress Notes (Signed)
Pt presents today for Venofer IV iron per provider's order. Vital signs stable and pt voiced no new complaints at this time.  Peripheral IV started with good blood return pre and post infusion.  Venofer 300 mg  given today per MD orders. Tolerated infusion without adverse affects. Vital signs stable. No complaints at this time. Discharged from clinic ambulatory in stable condition. Alert and oriented x 3. F/U with [facility] as scheduled.   

## 2022-03-06 ENCOUNTER — Ambulatory Visit (HOSPITAL_COMMUNITY): Payer: BC Managed Care – PPO

## 2022-03-13 ENCOUNTER — Inpatient Hospital Stay (HOSPITAL_COMMUNITY): Payer: BC Managed Care – PPO

## 2022-03-13 VITALS — BP 94/63 | HR 64 | Temp 96.4°F | Resp 18

## 2022-03-13 DIAGNOSIS — D6861 Antiphospholipid syndrome: Secondary | ICD-10-CM | POA: Diagnosis not present

## 2022-03-13 DIAGNOSIS — M810 Age-related osteoporosis without current pathological fracture: Secondary | ICD-10-CM

## 2022-03-13 DIAGNOSIS — E611 Iron deficiency: Secondary | ICD-10-CM

## 2022-03-13 DIAGNOSIS — D509 Iron deficiency anemia, unspecified: Secondary | ICD-10-CM

## 2022-03-13 MED ORDER — ACETAMINOPHEN 325 MG PO TABS
650.0000 mg | ORAL_TABLET | Freq: Once | ORAL | Status: AC
  Administered 2022-03-13: 650 mg via ORAL
  Filled 2022-03-13: qty 2

## 2022-03-13 MED ORDER — SODIUM CHLORIDE 0.9 % IV SOLN
300.0000 mg | Freq: Once | INTRAVENOUS | Status: AC
  Administered 2022-03-13: 300 mg via INTRAVENOUS
  Filled 2022-03-13: qty 300

## 2022-03-13 MED ORDER — LORATADINE 10 MG PO TABS
10.0000 mg | ORAL_TABLET | Freq: Once | ORAL | Status: AC
  Administered 2022-03-13: 10 mg via ORAL
  Filled 2022-03-13: qty 1

## 2022-03-13 MED ORDER — SODIUM CHLORIDE 0.9 % IV SOLN: Freq: Once | INTRAVENOUS | Status: AC

## 2022-03-13 NOTE — Progress Notes (Signed)
Pt presents today for Venofer IV iron infusion per provider's order. Vital signs stable and pt voiced no new complaints at this time.  Peripheral IV started with good blood return pre and post infusion.  Venofer 300 mg  given today per MD orders. Tolerated infusion without adverse affects. Vital signs stable. No complaints at this time. Discharged from clinic ambulatory in stable condition. Alert and oriented x 3. F/U with [facility] as scheduled.   

## 2022-03-13 NOTE — Patient Instructions (Signed)
[facility]  Discharge Instructions: Thank you for choosing [facility] to provide your oncology and hematology care.  If you have a lab appointment with the Cancer Center, please come in thru the Main Entrance and check in at the main information desk.  Wear comfortable clothing and clothing appropriate for easy access to any Portacath or PICC line.   We strive to give you quality time with your provider. You may need to reschedule your appointment if you arrive late (15 or more minutes).  Arriving late affects you and other patients whose appointments are after yours.  Also, if you miss three or more appointments without notifying the office, you may be dismissed from the clinic at the provider's discretion.      For prescription refill requests, have your pharmacy contact our office and allow 72 hours for refills to be completed.    Today you received Venofer IV iron infusion.     BELOW ARE SYMPTOMS THAT SHOULD BE REPORTED IMMEDIATELY: *FEVER GREATER THAN 100.4 F (38 C) OR HIGHER *CHILLS OR SWEATING *NAUSEA AND VOMITING THAT IS NOT CONTROLLED WITH YOUR NAUSEA MEDICATION *UNUSUAL SHORTNESS OF BREATH *UNUSUAL BRUISING OR BLEEDING *URINARY PROBLEMS (pain or burning when urinating, or frequent urination) *BOWEL PROBLEMS (unusual diarrhea, constipation, pain near the anus) TENDERNESS IN MOUTH AND THROAT WITH OR WITHOUT PRESENCE OF ULCERS (sore throat, sores in mouth, or a toothache) UNUSUAL RASH, SWELLING OR PAIN  UNUSUAL VAGINAL DISCHARGE OR ITCHING   Items with * indicate a potential emergency and should be followed up as soon as possible or go to the Emergency Department if any problems should occur.  Please show the CHEMOTHERAPY ALERT CARD or IMMUNOTHERAPY ALERT CARD at check-in to the Emergency Department and triage nurse.  Should you have questions after your visit or need to cancel or reschedule your appointment, please contact [facility]  [phone]  and follow the prompts.  Office hours are 8:00 a.m. to 4:30 p.m. Monday - Friday. Please note that voicemails left after 4:00 p.m. may not be returned until the following business day.  We are closed weekends and major holidays. You have access to a nurse at all times for urgent questions. Please call the main number to the clinic [phone] and follow the prompts.  For any non-urgent questions, you may also contact your provider using MyChart. We now offer e-Visits for anyone 18 and older to request care online for non-urgent symptoms. For details visit [email or web address].   Also download the MyChart app! Go to the app store, search "MyChart", open the app, select [facility], and log in with your MyChart username and password.  Due to Covid, a mask is required upon entering the hospital/clinic. If you do not have a mask, one will be given to you upon arrival. For doctor visits, patients may have 1 support person aged 18 or older with them. For treatment visits, patients cannot have anyone with them due to current Covid guidelines and our immunocompromised population.  

## 2022-03-19 ENCOUNTER — Telehealth: Payer: Self-pay | Admitting: Neurology

## 2022-03-19 ENCOUNTER — Other Ambulatory Visit: Payer: Self-pay | Admitting: Neurology

## 2022-03-19 MED ORDER — TOPIRAMATE ER 100 MG PO CAP24: 2.0000 | ORAL_CAPSULE | Freq: Every day | ORAL | 0 refills | Status: DC

## 2022-03-19 NOTE — Telephone Encounter (Signed)
Refills sent to pharmacy. Must keep appt 05/12/22 for further refills or may discuss management with PCP. ?

## 2022-03-19 NOTE — Telephone Encounter (Signed)
Pt request refill for TROKENDI XR 100 MG CP24 at [phone] ? ?Pt scheduled appt 05/12/22 at 7:30a ?

## 2022-04-10 ENCOUNTER — Other Ambulatory Visit (HOSPITAL_COMMUNITY): Payer: Self-pay | Admitting: Hematology

## 2022-04-10 DIAGNOSIS — E611 Iron deficiency: Secondary | ICD-10-CM

## 2022-05-09 ENCOUNTER — Other Ambulatory Visit (HOSPITAL_COMMUNITY): Payer: Self-pay | Admitting: Hematology

## 2022-05-09 DIAGNOSIS — E611 Iron deficiency: Secondary | ICD-10-CM

## 2022-05-12 ENCOUNTER — Encounter (HOSPITAL_COMMUNITY): Payer: Self-pay | Admitting: Hematology

## 2022-05-12 ENCOUNTER — Ambulatory Visit: Payer: BC Managed Care – PPO | Admitting: Neurology

## 2022-05-12 ENCOUNTER — Encounter: Payer: Self-pay | Admitting: Neurology

## 2022-05-12 VITALS — BP 101/62 | HR 78 | Ht 64.0 in | Wt 147.5 lb

## 2022-05-12 DIAGNOSIS — G43709 Chronic migraine without aura, not intractable, without status migrainosus: Secondary | ICD-10-CM

## 2022-05-12 DIAGNOSIS — G3184 Mild cognitive impairment, so stated: Secondary | ICD-10-CM

## 2022-05-12 DIAGNOSIS — G40301 Generalized idiopathic epilepsy and epileptic syndromes, not intractable, with status epilepticus: Secondary | ICD-10-CM | POA: Diagnosis not present

## 2022-05-12 MED ORDER — TOPIRAMATE 100 MG PO TABS: 100.0000 mg | ORAL_TABLET | Freq: Two times a day (BID) | ORAL | 3 refills | Status: DC
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
## 2022-05-16 ENCOUNTER — Encounter (HOSPITAL_COMMUNITY): Payer: Self-pay

## 2022-05-16 ENCOUNTER — Emergency Department (HOSPITAL_COMMUNITY)
Admission: EM | Admit: 2022-05-16 | Discharge: 2022-05-16 | Disposition: A | Discharge status: Home / Self Care | Payer: BC Managed Care – PPO | Attending: Emergency Medicine | Admitting: Emergency Medicine

## 2022-05-16 ENCOUNTER — Other Ambulatory Visit: Payer: Self-pay

## 2022-05-16 DIAGNOSIS — S46912A Strain of unspecified muscle, fascia and tendon at shoulder and upper arm level, left arm, initial encounter: Secondary | ICD-10-CM | POA: Insufficient documentation

## 2022-05-16 DIAGNOSIS — Z7901 Long term (current) use of anticoagulants: Secondary | ICD-10-CM | POA: Insufficient documentation

## 2022-05-16 DIAGNOSIS — X58XXXA Exposure to other specified factors, initial encounter: Secondary | ICD-10-CM | POA: Insufficient documentation

## 2022-05-16 DIAGNOSIS — T148XXA Other injury of unspecified body region, initial encounter: Secondary | ICD-10-CM

## 2022-05-16 DIAGNOSIS — S4992XA Unspecified injury of left shoulder and upper arm, initial encounter: Secondary | ICD-10-CM | POA: Diagnosis present

## 2022-05-16 MED ORDER — METHOCARBAMOL 500 MG PO TABS: 500.0000 mg | ORAL_TABLET | Freq: Two times a day (BID) | ORAL | 0 refills | Status: DC | PRN

## 2022-05-16 MED ORDER — KETOROLAC TROMETHAMINE 60 MG/2ML IM SOLN
60.0000 mg | Freq: Once | INTRAMUSCULAR | Status: AC
  Administered 2022-05-16: 60 mg via INTRAMUSCULAR
  Filled 2022-05-16: qty 2

## 2022-05-16 MED ORDER — NAPROXEN 500 MG PO TABS: 500.0000 mg | ORAL_TABLET | Freq: Two times a day (BID) | ORAL | 0 refills | Status: DC

## 2022-05-16 NOTE — ED Provider Notes (Signed)
[facility] EMERGENCY DEPARTMENT Provider Note   CSN: [id number] Arrival date & time: 05/16/22  1502     History  Chief Complaint  Patient presents with   Shoulder Injury    Samantha Fields is a 61 y.o. female.   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  She presents to the hospital today with a complaint of the left upper back and shoulder pain which started yesterday when she sneezed, she presents primarily because her husband is concerned that this may be her heart however she has no chest pain or shortness of breath.  She reports the pain is improved when she holds her arm above her head, she has some associated tingling in her middle and fourth finger of the left hand but states she has some chronic tingling in her fingers.  No weakness of the arm, pain has been persistent since last night, nonexertional  Home Medications Prior to Admission medications   Medication Sig Start Date End Date Taking? Authorizing Provider  Calcium Carb-Cholecalciferol (CALCIUM 600/VITAMIN D PO) Take 2 tablets by mouth daily.   Yes [provider]  citalopram (CELEXA) 40 MG tablet Take 1 tablet by mouth once daily 11/24/21  Yes [name] A, NP  Cyanocobalamin (GNP VITAMIN B-12 PO) Take by mouth.   Yes [provider]  denosumab (PROLIA) 60 MG/ML SOLN injection Inject 60 mg into the skin every 6 (six) months. Administer in upper arm, thigh, or abdomen   Yes [provider]  FERREX 150 150 MG capsule Take 1 capsule by mouth once daily 05/12/22  Yes Derek Jack, MD  methocarbamol (ROBAXIN) 500 MG tablet Take 1 tablet (500 mg total) by mouth 2 (two) times daily as needed for muscle spasms. 05/16/22  Yes Noemi Chapel, MD  naproxen (NAPROSYN) 500 MG tablet Take 1 tablet (500 mg total) by  mouth 2 (two) times daily with a meal. 05/16/22  Yes Noemi Chapel, MD  potassium chloride SA (KLOR-CON) 20 MEQ tablet TAKE 2 TABLETS BY MOUTH IN THE MORNING AND 1 TABLET IN THE EVENING Patient taking differently: 40 mEq 2 (two) times daily. TAKE 2 TABLETS BY MOUTH IN THE MORNING AND 2 TABLETs IN THE EVENING 10/30/20  Yes Derek Jack, MD  topiramate (TOPAMAX) 100 MG tablet Take 1 tablet (100 mg total) by mouth 2 (two) times daily. 05/12/22  Yes Marcial Pacas, MD  warfarin (COUMADIN) 3 MG tablet Take 1 tablet (3 mg total) by mouth daily. 02/11/22  Yes Pennington, Rebekah M, PA-C  WEGOVY 0.5 MG/0.5ML SOAJ Inject 1 mg into the skin once a week. 02/02/22  Yes [provider]  Topiramate ER (TROKENDI XR) 100 MG CP24 Take 2 capsules by mouth at bedtime. 03/19/22   Marcial Pacas, MD      Allergies    Lamictal [lamotrigine] and Fosamax [alendronate sodium]    Review of Systems   Review of Systems  All other systems reviewed and are negative.  Physical Exam Updated Vital Signs BP 104/73   Pulse 82   Resp 18   Ht 1.626 m ('5\' 4"'$ )   Wt 66.7 kg   SpO2 98%   BMI 25.23 kg/m  Physical Exam Vitals and nursing note reviewed.  Constitutional:      General: She is not in acute distress.    Appearance: She is  well-developed.  HENT:     Head: Normocephalic and atraumatic.     Mouth/Throat:     Pharynx: No oropharyngeal exudate.  Eyes:     General: No scleral icterus.       Right eye: No discharge.        Left eye: No discharge.     Conjunctiva/sclera: Conjunctivae normal.     Pupils: Pupils are equal, round, and reactive to light.  Neck:     Thyroid: No thyromegaly.     Vascular: No JVD.  Cardiovascular:     Rate and Rhythm: Normal rate and regular rhythm.     Heart sounds: Normal heart sounds. No murmur heard.   No friction rub. No gallop.  Pulmonary:     Effort: Pulmonary effort is normal. No respiratory distress.     Breath sounds: Normal breath sounds. No wheezing or rales.   Abdominal:     General: Bowel sounds are normal. There is no distension.     Palpations: Abdomen is soft. There is no mass.     Tenderness: There is no abdominal tenderness.  Musculoskeletal:        General: Tenderness present. Normal range of motion.     Cervical back: Normal range of motion and neck supple.     Right lower leg: No edema.     Left lower leg: No edema.     Comments: Totally normal range of motion of the shoulder on the left but there is some tenderness around the rhomboid in the periscapular area, there is no tenderness over the shoulder girdle.  Lymphadenopathy:     Cervical: No cervical adenopathy.  Skin:    General: Skin is warm and dry.     Findings: No erythema or rash.  Neurological:     Mental Status: She is alert.     Coordination: Coordination normal.     Comments: Totally normal strength of the bilateral upper extremities at the shoulders elbows and wrist.  Normal grips.  Psychiatric:        Behavior: Behavior normal.    ED Results / Procedures / Treatments   Labs (all labs ordered are listed, but only abnormal results are displayed) Labs Reviewed - No data to display  EKG EKG Interpretation  Date/Time:  Saturday May 16 2022 15:26:57 EDT Ventricular Rate:  69 PR Interval:  163 QRS Duration: 95 QT Interval:  402 QTC Calculation: 431 R Axis:   98 Text Interpretation: Sinus rhythm Right axis deviation Poor R wave progression No old tracing to compare Confirmed by Noemi Chapel [phone]) on 05/16/2022 3:34:16 PM  Radiology No results found.  Procedures Procedures    Medications Ordered in ED Medications  ketorolac (TORADOL) injection 60 mg (60 mg Intramuscular Given 05/16/22 1522)    ED Course/ Medical Decision Making/ A&P                           Medical Decision Making Amount and/or Complexity of Data Reviewed ECG/medicine tests: ordered.  Risk Prescription drug management.   Overall this patient appears to be in no distress,  vitals are normal, she is concerned that this may be a heart attack but has no cardiac symptoms whatsoever.  This sounds to be much more muscular or potentially radicular, either way she will be given a dose of Toradol, a single dose should not be contraindicated while the patient is on Coumadin, she has no bleeding issues at this time.  Additionally  she will be given a muscle relaxer and was given the appropriate expected timeframe of healing, she is agreeable and stable for discharge.  EKG is unremarkable, no signs of acute ischemia or arrhythmia, this fits with the patient's history of more of a muscular etiology.  She was given ketorolac intramuscular, home with Robaxin and Naprosyn, patient agreeable.        Final Clinical Impression(s) / ED Diagnoses Final diagnoses:  Muscle strain    Rx / DC Orders ED Discharge Orders          Ordered    naproxen (NAPROSYN) 500 MG tablet  2 times daily with meals        05/16/22 1536    methocarbamol (ROBAXIN) 500 MG tablet  2 times daily PRN        05/16/22 1536              Noemi Chapel, MD 05/16/22 1537

## 2022-05-16 NOTE — ED Triage Notes (Signed)
Reports that last night pt held in sneeze and started to have L shoulder pain that radiates to back.  Pt is alert and oriented.  Resp even and unlabored.  Skin warm and dry.  nad

## 2022-05-16 NOTE — Discharge Instructions (Signed)
Please take Naprosyn, '500mg'$  by mouth twice daily as needed for pain - this in an antiinflammatory medicine (NSAID) and is similar to ibuprofen - many people feel that it is stronger than ibuprofen and it is easier to take since it is a smaller pill.  Please use this only for 1 week - if your pain persists, you will need to follow up with your doctor in the office for ongoing guidance and pain control.  Use this medication sparingly as it can predispose you to increased bleeding while you are on Coumadin  Please take Robaxin, 500 mg up to 2 or 3 times a day as needed for muscle spasm, this is a muscle relaxer, it may cause generalized weakness, sleepiness and you should not drive or do important things while taking this medication.  This includes driving a vehicle or taking care of young children, these things should not be done while taking this medication.    Thank you for allowing [place] to treat you in the emergency department today.  After reviewing your examination and potential testing that was done it appears that you are safe to go home.  I would like for you to follow-up with your doctor within the next several days, have them obtain your results and follow-up with them to review all of these tests.  If you should develop severe or worsening symptoms return to the emergency department immediately

## 2022-06-02 ENCOUNTER — Other Ambulatory Visit (HOSPITAL_COMMUNITY): Payer: Self-pay | Admitting: Hematology

## 2022-06-02 DIAGNOSIS — E611 Iron deficiency: Secondary | ICD-10-CM

## 2022-06-04 ENCOUNTER — Other Ambulatory Visit (HOSPITAL_COMMUNITY): Payer: Self-pay | Admitting: Internal Medicine

## 2022-06-04 DIAGNOSIS — Z1231 Encounter for screening mammogram for malignant neoplasm of breast: Secondary | ICD-10-CM

## 2022-06-15 ENCOUNTER — Ambulatory Visit (HOSPITAL_COMMUNITY)
Admission: RE | Admit: 2022-06-15 | Discharge: 2022-06-15 | Disposition: A | Discharge status: Home / Self Care | Payer: BC Managed Care – PPO | Source: Ambulatory Visit | Attending: Internal Medicine | Admitting: Internal Medicine

## 2022-06-15 DIAGNOSIS — Z1231 Encounter for screening mammogram for malignant neoplasm of breast: Secondary | ICD-10-CM | POA: Diagnosis present

## 2022-06-26 ENCOUNTER — Other Ambulatory Visit (HOSPITAL_COMMUNITY): Payer: Self-pay | Admitting: Hematology

## 2022-06-26 DIAGNOSIS — E611 Iron deficiency: Secondary | ICD-10-CM

## 2022-06-29 ENCOUNTER — Encounter (HOSPITAL_COMMUNITY): Payer: Self-pay | Admitting: Hematology

## 2022-07-22 ENCOUNTER — Other Ambulatory Visit (HOSPITAL_COMMUNITY): Payer: Self-pay | Admitting: Hematology

## 2022-07-22 DIAGNOSIS — E611 Iron deficiency: Secondary | ICD-10-CM

## 2022-08-12 ENCOUNTER — Inpatient Hospital Stay: Payer: BC Managed Care – PPO | Attending: Physician Assistant

## 2022-08-12 DIAGNOSIS — D6861 Antiphospholipid syndrome: Secondary | ICD-10-CM | POA: Insufficient documentation

## 2022-08-12 DIAGNOSIS — Z79899 Other long term (current) drug therapy: Secondary | ICD-10-CM | POA: Insufficient documentation

## 2022-08-12 DIAGNOSIS — M810 Age-related osteoporosis without current pathological fracture: Secondary | ICD-10-CM

## 2022-08-12 DIAGNOSIS — D509 Iron deficiency anemia, unspecified: Secondary | ICD-10-CM | POA: Diagnosis present

## 2022-08-12 DIAGNOSIS — E538 Deficiency of other specified B group vitamins: Secondary | ICD-10-CM

## 2022-08-12 LAB — CBC WITH DIFFERENTIAL/PLATELET
Abs Immature Granulocytes: 0.02 10*3/uL (ref 0.00–0.07)
Basophils Absolute: 0.1 10*3/uL (ref 0.0–0.1)
Basophils Relative: 1 %
Eosinophils Absolute: 0.1 10*3/uL (ref 0.0–0.5)
Eosinophils Relative: 2 %
HCT: 41.6 % (ref 36.0–46.0)
Hemoglobin: 13.3 g/dL (ref 12.0–15.0)
Immature Granulocytes: 0 %
Lymphocytes Relative: 16 %
Lymphs Abs: 0.9 10*3/uL (ref 0.7–4.0)
MCH: 33.2 pg (ref 26.0–34.0)
MCHC: 32 g/dL (ref 30.0–36.0)
MCV: 103.7 fL — ABNORMAL HIGH (ref 80.0–100.0)
Monocytes Absolute: 0.5 10*3/uL (ref 0.1–1.0)
Monocytes Relative: 9 %
Neutro Abs: 4 10*3/uL (ref 1.7–7.7)
Neutrophils Relative %: 72 %
Platelets: 193 10*3/uL (ref 150–400)
RBC: 4.01 MIL/uL (ref 3.87–5.11)
RDW: 13.2 % (ref 11.5–15.5)
WBC: 5.6 10*3/uL (ref 4.0–10.5)
nRBC: 0 % (ref 0.0–0.2)

## 2022-08-12 LAB — COMPREHENSIVE METABOLIC PANEL
ALT: 24 U/L (ref 0–44)
AST: 24 U/L (ref 15–41)
Albumin: 4.1 g/dL (ref 3.5–5.0)
Alkaline Phosphatase: 58 U/L (ref 38–126)
Anion gap: 8 (ref 5–15)
BUN: 16 mg/dL (ref 8–23)
CO2: 21 mmol/L — ABNORMAL LOW (ref 22–32)
Calcium: 9.1 mg/dL (ref 8.9–10.3)
Chloride: 112 mmol/L — ABNORMAL HIGH (ref 98–111)
Creatinine, Ser: 1.3 mg/dL — ABNORMAL HIGH (ref 0.44–1.00)
GFR, Estimated: 46 mL/min — ABNORMAL LOW (ref >60)
Glucose, Bld: 65 mg/dL — ABNORMAL LOW (ref 70–99)
Potassium: 4.5 mmol/L (ref 3.5–5.1)
Sodium: 141 mmol/L (ref 135–145)
Total Bilirubin: 0.5 mg/dL (ref 0.3–1.2)
Total Protein: 6.6 g/dL (ref 6.5–8.1)

## 2022-08-12 LAB — IRON AND TIBC
Iron: 105 ug/dL (ref 28–170)
Saturation Ratios: 35 % — ABNORMAL HIGH (ref 10.4–31.8)
TIBC: 297 ug/dL (ref 250–450)
UIBC: 192 ug/dL

## 2022-08-12 LAB — VITAMIN B12: Vitamin B-12: 916 pg/mL — ABNORMAL HIGH (ref 180–914)

## 2022-08-12 LAB — FERRITIN: Ferritin: 290 ng/mL (ref 11–307)

## 2022-08-12 LAB — FOLATE: Folate: 25.3 ng/mL (ref >5.9)

## 2022-08-16 LAB — METHYLMALONIC ACID, SERUM: Methylmalonic Acid, Quantitative: 172 nmol/L (ref 0–378)

## 2022-08-19 ENCOUNTER — Inpatient Hospital Stay: Payer: BC Managed Care – PPO | Attending: Physician Assistant | Admitting: Physician Assistant

## 2022-08-19 ENCOUNTER — Inpatient Hospital Stay: Payer: BC Managed Care – PPO

## 2022-08-19 DIAGNOSIS — Z8261 Family history of arthritis: Secondary | ICD-10-CM | POA: Insufficient documentation

## 2022-08-19 DIAGNOSIS — M810 Age-related osteoporosis without current pathological fracture: Secondary | ICD-10-CM

## 2022-08-19 DIAGNOSIS — G2581 Restless legs syndrome: Secondary | ICD-10-CM | POA: Diagnosis not present

## 2022-08-19 DIAGNOSIS — Z8249 Family history of ischemic heart disease and other diseases of the circulatory system: Secondary | ICD-10-CM | POA: Insufficient documentation

## 2022-08-19 DIAGNOSIS — E611 Iron deficiency: Secondary | ICD-10-CM

## 2022-08-19 DIAGNOSIS — D509 Iron deficiency anemia, unspecified: Secondary | ICD-10-CM | POA: Diagnosis present

## 2022-08-19 DIAGNOSIS — Z7901 Long term (current) use of anticoagulants: Secondary | ICD-10-CM | POA: Diagnosis not present

## 2022-08-19 DIAGNOSIS — Z833 Family history of diabetes mellitus: Secondary | ICD-10-CM | POA: Diagnosis not present

## 2022-08-19 DIAGNOSIS — Z818 Family history of other mental and behavioral disorders: Secondary | ICD-10-CM | POA: Insufficient documentation

## 2022-08-19 DIAGNOSIS — D6861 Antiphospholipid syndrome: Secondary | ICD-10-CM | POA: Diagnosis present

## 2022-08-19 DIAGNOSIS — Z79899 Other long term (current) drug therapy: Secondary | ICD-10-CM | POA: Diagnosis not present

## 2022-08-19 DIAGNOSIS — E538 Deficiency of other specified B group vitamins: Secondary | ICD-10-CM

## 2022-08-19 DIAGNOSIS — F17210 Nicotine dependence, cigarettes, uncomplicated: Secondary | ICD-10-CM | POA: Diagnosis not present

## 2022-08-19 MED ORDER — DENOSUMAB 60 MG/ML ~~LOC~~ SOSY
60.0000 mg | PREFILLED_SYRINGE | Freq: Once | SUBCUTANEOUS | Status: AC
  Administered 2022-08-19: 60 mg via SUBCUTANEOUS
  Filled 2022-08-19: qty 1
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
## 2022-08-19 NOTE — Patient Instructions (Signed)
[facility]  Discharge Instructions: Thank you for choosing [facility] to provide your oncology and hematology care.  If you have a lab appointment with the [facility], please come in thru the Main Entrance and check in at the main information desk.  Wear comfortable clothing and clothing appropriate for easy access to any Portacath or PICC line.   We strive to give you quality time with your provider. You may need to reschedule your appointment if you arrive late (15 or more minutes).  Arriving late affects you and other patients whose appointments are after yours.  Also, if you miss three or more appointments without notifying the office, you may be dismissed from the clinic at the provider's discretion.      For prescription refill requests, have your pharmacy contact our office and allow 72 hours for refills to be completed.    Today you received the following chemotherapy and/or immunotherapy agents prolia.  Denosumab Injection (Osteoporosis) What is this medication? DENOSUMAB (den oh SUE mab) prevents and treats osteoporosis. It works by Paramedic stronger and less likely to break (fracture). It is a monoclonal antibody. This medicine may be used for other purposes; ask your health care provider or pharmacist if you have questions. COMMON BRAND NAME(S): Prolia What should I tell my care team before I take this medication? They need to know if you have any of these conditions: Dental or gum disease, or plan to have dental surgery or a tooth pulled Infection Kidney disease Low levels of calcium or vitamin D in your blood On dialysis Poor nutrition Skin conditions Thyroid disease, or have had thyroid or parathyroid surgery Trouble absorbing minerals in your stomach or intestine An unusual reaction to denosumab, other medications, foods, dyes, or preservatives Pregnant or trying to get pregnant Breast-feeding How should I use this  medication? This medication is injected under the skin. It is given by your care team in a hospital or clinic setting. A special MedGuide will be given to you before each treatment. Be sure to read this information carefully each time. Talk to your care team about the use of this medication in children. Special care may be needed. Overdosage: If you think you have taken too much of this medicine contact a poison control center or emergency room at once. NOTE: This medicine is only for you. Do not share this medicine with others. What if I miss a dose? Keep appointments for follow-up doses. It is important not to miss your dose. Call your care team if you are unable to keep an appointment. What may interact with this medication? Do not take this medication with any of the following: Other medications that contain denosumab This medication may also interact with the following: Medications that lower your chance of fighting infection Steroid medications, such as prednisone or cortisone This list may not describe all possible interactions. Give your health care provider a list of all the medicines, herbs, non-prescription drugs, or dietary supplements you use. Also tell them if you smoke, drink alcohol, or use illegal drugs. Some items may interact with your medicine. What should I watch for while using this medication? Your condition will be monitored carefully while you are receiving this medication. You may need blood work while taking this medication. This medication may increase your risk of getting an infection. Call your care team for advice if you get a fever, chills, sore throat, or other symptoms of a cold or flu. Do  not treat yourself. Try to avoid being around people who are sick. Tell your dentist and dental surgeon that you are taking this medication. You should not have major dental surgery while on this medication. See your dentist to have a dental exam and fix any dental problems  before starting this medication. Take good care of your teeth while on this medication. Make sure you see your dentist for regular follow-up appointments. You should make sure you get enough calcium and vitamin D while you are taking this medication. Discuss the foods you eat and the vitamins you take with your care team. Talk to your care team if you are pregnant or think you might be pregnant. This medication can cause serious birth defects if taken during pregnancy and for 5 months after the last dose. You will need a negative pregnancy test before starting this medication. Contraception is recommended while taking this medication and for 5 months after the last dose. Your care team can help you find the option that works for you. Talk to your care team before breastfeeding. Changes to your treatment plan may be needed. What side effects may I notice from receiving this medication? Side effects that you should report to your care team as soon as possible: Allergic reactions--skin rash, itching, hives, swelling of the face, lips, tongue, or throat Infection--fever, chills, cough, sore throat, wounds that don't heal, pain or trouble when passing urine, general feeling of discomfort or being unwell Low calcium level--muscle pain or cramps, confusion, tingling, or numbness in the hands or feet Osteonecrosis of the jaw--pain, swelling, or redness in the mouth, numbness of the jaw, poor healing after dental work, unusual discharge from the mouth, visible bones in the mouth Severe bone, joint, or muscle pain Skin infection--skin redness, swelling, warmth, or pain Side effects that usually do not require medical attention (report these to your care team if they continue or are bothersome): Back pain Headache Joint pain Muscle pain Pain in the hands, arms, legs, or feet Runny or stuffy nose Sore throat This list may not describe all possible side effects. Call your doctor for medical advice about side  effects. You may report side effects to FDA at 1-800-FDA-1088. Where should I keep my medication? This medication is given in a hospital or clinic. It will not be stored at home. NOTE: This sheet is a summary. It may not cover all possible information. If you have questions about this medicine, talk to your doctor, pharmacist, or health care provider.  2023 Elsevier/Gold Standard (2022-04-13 00:00:00)       To help prevent nausea and vomiting after your treatment, we encourage you to take your nausea medication as directed.  BELOW ARE SYMPTOMS THAT SHOULD BE REPORTED IMMEDIATELY: *FEVER GREATER THAN 100.4 F (38 C) OR HIGHER *CHILLS OR SWEATING *NAUSEA AND VOMITING THAT IS NOT CONTROLLED WITH YOUR NAUSEA MEDICATION *UNUSUAL SHORTNESS OF BREATH *UNUSUAL BRUISING OR BLEEDING *URINARY PROBLEMS (pain or burning when urinating, or frequent urination) *BOWEL PROBLEMS (unusual diarrhea, constipation, pain near the anus) TENDERNESS IN MOUTH AND THROAT WITH OR WITHOUT PRESENCE OF ULCERS (sore throat, sores in mouth, or a toothache) UNUSUAL RASH, SWELLING OR PAIN  UNUSUAL VAGINAL DISCHARGE OR ITCHING   Items with * indicate a potential emergency and should be followed up as soon as possible or go to the Emergency Department if any problems should occur.  Please show the CHEMOTHERAPY ALERT CARD or IMMUNOTHERAPY ALERT CARD at check-in to the Emergency Department and triage nurse.  Should  you have questions after your visit or need to cancel or reschedule your appointment, please contact [facility] [phone]  and follow the prompts.  Office hours are 8:00 a.m. to 4:30 p.m. Monday - Friday. Please note that voicemails left after 4:00 p.m. may not be returned until the following business day.  We are closed weekends and major holidays. You have access to a nurse at all times for urgent questions. Please call the main number to the clinic [phone] and follow the  prompts.  For any non-urgent questions, you may also contact your provider using MyChart. We now offer e-Visits for anyone 36 and older to request care online for non-urgent symptoms. For details visit [email or web address].   Also download the MyChart app! Go to the app store, search "MyChart", open the app, select [facility], and log in with your MyChart username and password.  Masks are optional in the cancer centers. If you would like for your care team to wear a mask while they are taking care of you, please let them know. You may have one support person who is at least 62 years old accompany you for your appointments.

## 2022-08-19 NOTE — Patient Instructions (Signed)
[place] at [facility] Discharge Instructions  You were seen today by Tarri Abernethy PA-C for your antiphospholipid syndrome with history of multiple DVTs, iron deficiency, and osteoporosis.    ANTIPHOSPHOLIPID SYNDROME: Continue Coumadin.  Continue INR checks with Dr. Nevada Crane  IRON DEFICIENCY: You do not need any IV iron at this time.  You can restart your iron tablets in about 3 months.  OSTEOPOROSIS: Continue Prolia injections every 6 months.  B12 DEFICIENCY: Your B12 level is normal.  You do not need any B12 supplement at this time.  We will recheck in 6 months.  FOLLOW-UP APPOINTMENT: Repeat labs plus phone visit in 6 months.  ** Thank you for trusting me with your healthcare!  I strive to provide all of my patients with quality care at each visit.  If you receive a survey for this visit, I would be so grateful to you for taking the time to provide feedback.  Thank you in advance!  ~ Maston Wight                   Dr. Derek Jack   &   Tarri Abernethy, PA-C   - - - - - - - - - - - - - - - - - -     Thank you for choosing [place] at [facility] to provide your oncology and hematology care.  To afford each patient quality time with our provider, please arrive at least 15 minutes before your scheduled appointment time.   If you have a lab appointment with the [place] please come in thru the Main Entrance and check in at the main information desk.  You need to re-schedule your appointment should you arrive 10 or more minutes late.  We strive to give you quality time with our providers, and arriving late affects you and other patients whose appointments are after yours.  Also, if you no show three or more times for appointments you may be dismissed from the clinic at the providers discretion.     Again, thank you for choosing [facility].  Our hope is that these requests will decrease the amount of time that you  wait before being seen by our physicians.       _____________________________________________________________  Should you have questions after your visit to [facility], please contact our office at [phone] and follow the prompts.  Our office hours are 8:00 a.m. and 4:30 p.m. Monday - Friday.  Please note that voicemails left after 4:00 p.m. may not be returned until the following business day.  We are closed weekends and major holidays.  You do have access to a nurse 24-7, just call the main number to the clinic [phone] and do not press any options, hold on the line and a nurse will answer the phone.    For prescription refill requests, have your pharmacy contact our office and allow 72 hours.    Due to Covid, you will need to wear a mask upon entering the hospital. If you do not have a mask, a mask will be given to you at the Main Entrance upon arrival. For doctor visits, patients may have 1 support person age 81 or older with them. For treatment visits, patients can not have anyone with them due to social distancing guidelines and our immunocompromised population.

## 2022-08-19 NOTE — Progress Notes (Signed)
Patient taking calcium as directed.  Denied tooth, jaw, and leg pain.  No recent or upcoming dental visits.  Labs reviewed.  Patient tolerated injection with no complaints voiced.  See MAR for details.  Patient stable during and after injection.  Site clean and dry with no bruising or swelling noted.  Band aid applied.  Vss with discharge and left in satisfactory condition with no s/s of distress noted.   

## 2022-10-25 ENCOUNTER — Other Ambulatory Visit (HOSPITAL_COMMUNITY): Payer: Self-pay | Admitting: Hematology

## 2022-10-25 DIAGNOSIS — E611 Iron deficiency: Secondary | ICD-10-CM

## 2022-11-22 ENCOUNTER — Other Ambulatory Visit (HOSPITAL_COMMUNITY): Payer: Self-pay | Admitting: Hematology

## 2022-11-22 DIAGNOSIS — E611 Iron deficiency: Secondary | ICD-10-CM

## 2022-11-24 ENCOUNTER — Encounter (HOSPITAL_COMMUNITY): Payer: Self-pay | Admitting: Hematology

## 2022-12-02 ENCOUNTER — Other Ambulatory Visit: Payer: Self-pay | Admitting: Adult Health

## 2022-12-25 ENCOUNTER — Other Ambulatory Visit (HOSPITAL_COMMUNITY): Payer: Self-pay | Admitting: Hematology

## 2022-12-25 DIAGNOSIS — E611 Iron deficiency: Secondary | ICD-10-CM

## 2023-02-02 ENCOUNTER — Telehealth: Payer: Self-pay | Admitting: Family Medicine

## 2023-02-02 ENCOUNTER — Encounter (HOSPITAL_COMMUNITY): Payer: Self-pay | Admitting: Hematology

## 2023-02-02 NOTE — Telephone Encounter (Signed)
LVM and sent mychart msg informing pt of need to reschedule 05/17/23 appointment - NP out

## 2023-02-16 ENCOUNTER — Telehealth: Payer: Self-pay

## 2023-02-16 NOTE — Telephone Encounter (Signed)
Just an FYI - Patient is having surgery on her knee and will have to be placed under anesthesia. Raliegh Ip is sending a medical clearance form for the surgery. Ms. Dominski was advised to call our office to make [place] aware of this.

## 2023-02-18 ENCOUNTER — Inpatient Hospital Stay: Payer: BC Managed Care – PPO

## 2023-02-18 ENCOUNTER — Ambulatory Visit: Payer: BC Managed Care – PPO

## 2023-02-18 NOTE — Telephone Encounter (Signed)
Forms received, placed on MD desk for review and signature

## 2023-02-19 ENCOUNTER — Inpatient Hospital Stay: Payer: BC Managed Care – PPO | Attending: Hematology

## 2023-02-19 ENCOUNTER — Inpatient Hospital Stay: Payer: BC Managed Care – PPO

## 2023-02-19 DIAGNOSIS — F17210 Nicotine dependence, cigarettes, uncomplicated: Secondary | ICD-10-CM | POA: Diagnosis not present

## 2023-02-19 DIAGNOSIS — Z7901 Long term (current) use of anticoagulants: Secondary | ICD-10-CM | POA: Diagnosis not present

## 2023-02-19 DIAGNOSIS — M2550 Pain in unspecified joint: Secondary | ICD-10-CM | POA: Diagnosis not present

## 2023-02-19 DIAGNOSIS — Z8261 Family history of arthritis: Secondary | ICD-10-CM | POA: Insufficient documentation

## 2023-02-19 DIAGNOSIS — E611 Iron deficiency: Secondary | ICD-10-CM | POA: Insufficient documentation

## 2023-02-19 DIAGNOSIS — E538 Deficiency of other specified B group vitamins: Secondary | ICD-10-CM

## 2023-02-19 DIAGNOSIS — Z8249 Family history of ischemic heart disease and other diseases of the circulatory system: Secondary | ICD-10-CM | POA: Diagnosis not present

## 2023-02-19 DIAGNOSIS — Z79899 Other long term (current) drug therapy: Secondary | ICD-10-CM | POA: Diagnosis not present

## 2023-02-19 DIAGNOSIS — Z90710 Acquired absence of both cervix and uterus: Secondary | ICD-10-CM | POA: Insufficient documentation

## 2023-02-19 DIAGNOSIS — Z833 Family history of diabetes mellitus: Secondary | ICD-10-CM | POA: Diagnosis not present

## 2023-02-19 DIAGNOSIS — Z818 Family history of other mental and behavioral disorders: Secondary | ICD-10-CM | POA: Insufficient documentation

## 2023-02-19 DIAGNOSIS — D6861 Antiphospholipid syndrome: Secondary | ICD-10-CM | POA: Insufficient documentation

## 2023-02-19 DIAGNOSIS — G2581 Restless legs syndrome: Secondary | ICD-10-CM | POA: Insufficient documentation

## 2023-02-19 DIAGNOSIS — M810 Age-related osteoporosis without current pathological fracture: Secondary | ICD-10-CM | POA: Diagnosis not present

## 2023-02-19 DIAGNOSIS — D509 Iron deficiency anemia, unspecified: Secondary | ICD-10-CM

## 2023-02-19 LAB — CBC WITH DIFFERENTIAL/PLATELET
Abs Immature Granulocytes: 0 10*3/uL (ref 0.00–0.07)
Basophils Absolute: 0.1 10*3/uL (ref 0.0–0.1)
Basophils Relative: 2 %
Eosinophils Absolute: 0.1 10*3/uL (ref 0.0–0.5)
Eosinophils Relative: 2 %
HCT: 44.5 % (ref 36.0–46.0)
Hemoglobin: 14 g/dL (ref 12.0–15.0)
Immature Granulocytes: 0 %
Lymphocytes Relative: 19 %
Lymphs Abs: 1 10*3/uL (ref 0.7–4.0)
MCH: 32.3 pg (ref 26.0–34.0)
MCHC: 31.5 g/dL (ref 30.0–36.0)
MCV: 102.5 fL — ABNORMAL HIGH (ref 80.0–100.0)
Monocytes Absolute: 0.5 10*3/uL (ref 0.1–1.0)
Monocytes Relative: 10 %
Neutro Abs: 3.6 10*3/uL (ref 1.7–7.7)
Neutrophils Relative %: 67 %
Platelets: 182 10*3/uL (ref 150–400)
RBC: 4.34 MIL/uL (ref 3.87–5.11)
RDW: 12.6 % (ref 11.5–15.5)
WBC: 5.3 10*3/uL (ref 4.0–10.5)
nRBC: 0 % (ref 0.0–0.2)

## 2023-02-19 LAB — IRON AND TIBC
Iron: 113 ug/dL (ref 28–170)
Saturation Ratios: 34 % — ABNORMAL HIGH (ref 10.4–31.8)
TIBC: 330 ug/dL (ref 250–450)
UIBC: 217 ug/dL

## 2023-02-19 LAB — COMPREHENSIVE METABOLIC PANEL
ALT: 29 U/L (ref 0–44)
AST: 26 U/L (ref 15–41)
Albumin: 4.1 g/dL (ref 3.5–5.0)
Alkaline Phosphatase: 50 U/L (ref 38–126)
Anion gap: 6 (ref 5–15)
BUN: 16 mg/dL (ref 8–23)
CO2: 22 mmol/L (ref 22–32)
Calcium: 8.7 mg/dL — ABNORMAL LOW (ref 8.9–10.3)
Chloride: 107 mmol/L (ref 98–111)
Creatinine, Ser: 1.02 mg/dL — ABNORMAL HIGH (ref 0.44–1.00)
GFR, Estimated: >60 mL/min (ref >60)
Glucose, Bld: 82 mg/dL (ref 70–99)
Potassium: 3.7 mmol/L (ref 3.5–5.1)
Sodium: 135 mmol/L (ref 135–145)
Total Bilirubin: 0.9 mg/dL (ref 0.3–1.2)
Total Protein: 6.8 g/dL (ref 6.5–8.1)

## 2023-02-19 LAB — VITAMIN B12: Vitamin B-12: 1431 pg/mL — ABNORMAL HIGH (ref 180–914)

## 2023-02-19 LAB — FOLATE: Folate: 11.9 ng/mL (ref >5.9)

## 2023-02-19 LAB — FERRITIN: Ferritin: 228 ng/mL (ref 11–307)

## 2023-02-24 LAB — METHYLMALONIC ACID, SERUM: Methylmalonic Acid, Quantitative: 210 nmol/L (ref 0–378)

## 2023-02-24 NOTE — Telephone Encounter (Signed)
Form faxed
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
## 2023-02-25 ENCOUNTER — Ambulatory Visit: Payer: BC Managed Care – PPO | Admitting: Physician Assistant

## 2023-02-25 ENCOUNTER — Other Ambulatory Visit: Payer: Self-pay

## 2023-02-25 ENCOUNTER — Inpatient Hospital Stay: Payer: BC Managed Care – PPO | Admitting: Physician Assistant

## 2023-02-25 VITALS — BP 108/67 | HR 75 | Temp 98.0°F | Resp 17 | Ht 64.0 in | Wt 127.6 lb

## 2023-02-25 DIAGNOSIS — E611 Iron deficiency: Secondary | ICD-10-CM

## 2023-02-25 DIAGNOSIS — D509 Iron deficiency anemia, unspecified: Secondary | ICD-10-CM | POA: Diagnosis not present

## 2023-02-25 DIAGNOSIS — M810 Age-related osteoporosis without current pathological fracture: Secondary | ICD-10-CM

## 2023-02-25 DIAGNOSIS — D6861 Antiphospholipid syndrome: Secondary | ICD-10-CM

## 2023-02-25 DIAGNOSIS — E538 Deficiency of other specified B group vitamins: Secondary | ICD-10-CM

## 2023-02-25 MED ORDER — POLYSACCHARIDE IRON COMPLEX 150 MG PO CAPS: 150.0000 mg | ORAL_CAPSULE | ORAL | 3 refills | Status: DC

## 2023-02-25 NOTE — Patient Instructions (Signed)
[place] at [facility] Discharge Instructions  You were seen today by Tarri Abernethy PA-C for your antiphospholipid syndrome with history of multiple DVTs, iron deficiency, and osteoporosis.    ANTIPHOSPHOLIPID SYNDROME: Continue Coumadin.  Continue INR checks with Dr. Nevada Crane  IRON DEFICIENCY: You can continue iron tablets 2-3 times weekly.  OSTEOPOROSIS: Continue Prolia injections every 6 months, once you have been cleared by orthopedic doctor.  B12 DEFICIENCY: Your B12 level is normal.  You do not need any B12 supplement at this time.   FOLLOW-UP APPOINTMENT: Labs in 1 year followed by office visit  ** Thank you for trusting me with your healthcare!  I strive to provide all of my patients with quality care at each visit.  If you receive a survey for this visit, I would be so grateful to you for taking the time to provide feedback.  Thank you in advance!  ~ Leron Stoffers                   Dr. Derek Jack   &   Tarri Abernethy, PA-C   - - - - - - - - - - - - - - - - - -     Thank you for choosing [place] at [facility] to provide your oncology and hematology care.  To afford each patient quality time with our provider, please arrive at least 15 minutes before your scheduled appointment time.   If you have a lab appointment with the [place] please come in thru the Main Entrance and check in at the main information desk.  You need to re-schedule your appointment should you arrive 10 or more minutes late.  We strive to give you quality time with our providers, and arriving late affects you and other patients whose appointments are after yours.  Also, if you no show three or more times for appointments you may be dismissed from the clinic at the providers discretion.     Again, thank you for choosing [facility].  Our hope is that these requests will decrease the amount of time that you wait before being seen by our  physicians.       _____________________________________________________________  Should you have questions after your visit to [facility], please contact our office at [phone] and follow the prompts.  Our office hours are 8:00 a.m. and 4:30 p.m. Monday - Friday.  Please note that voicemails left after 4:00 p.m. may not be returned until the following business day.  We are closed weekends and major holidays.  You do have access to a nurse 24-7, just call the main number to the clinic [phone] and do not press any options, hold on the line and a nurse will answer the phone.    For prescription refill requests, have your pharmacy contact our office and allow 72 hours.    Due to Covid, you will need to wear a mask upon entering the hospital. If you do not have a mask, a mask will be given to you at the Main Entrance upon arrival. For doctor visits, patients may have 1 support person age 12 or older with them. For treatment visits, patients can not have anyone with them due to social distancing guidelines and our immunocompromised population.

## 2023-03-03 HISTORY — PX: KNEE SURGERY: SHX244

## 2023-04-07 ENCOUNTER — Inpatient Hospital Stay: Payer: BC Managed Care – PPO | Attending: Hematology

## 2023-04-07 VITALS — BP 99/70 | HR 68 | Temp 97.8°F | Resp 18

## 2023-04-07 DIAGNOSIS — E611 Iron deficiency: Secondary | ICD-10-CM | POA: Insufficient documentation

## 2023-04-07 DIAGNOSIS — Z8249 Family history of ischemic heart disease and other diseases of the circulatory system: Secondary | ICD-10-CM | POA: Insufficient documentation

## 2023-04-07 DIAGNOSIS — D509 Iron deficiency anemia, unspecified: Secondary | ICD-10-CM

## 2023-04-07 DIAGNOSIS — Z79899 Other long term (current) drug therapy: Secondary | ICD-10-CM | POA: Insufficient documentation

## 2023-04-07 DIAGNOSIS — Z833 Family history of diabetes mellitus: Secondary | ICD-10-CM | POA: Diagnosis not present

## 2023-04-07 DIAGNOSIS — E538 Deficiency of other specified B group vitamins: Secondary | ICD-10-CM | POA: Insufficient documentation

## 2023-04-07 DIAGNOSIS — F17210 Nicotine dependence, cigarettes, uncomplicated: Secondary | ICD-10-CM | POA: Insufficient documentation

## 2023-04-07 DIAGNOSIS — M810 Age-related osteoporosis without current pathological fracture: Secondary | ICD-10-CM | POA: Diagnosis not present

## 2023-04-07 DIAGNOSIS — Z8261 Family history of arthritis: Secondary | ICD-10-CM | POA: Diagnosis not present

## 2023-04-07 DIAGNOSIS — D6861 Antiphospholipid syndrome: Secondary | ICD-10-CM | POA: Diagnosis present

## 2023-04-07 DIAGNOSIS — Z818 Family history of other mental and behavioral disorders: Secondary | ICD-10-CM | POA: Insufficient documentation

## 2023-04-07 MED ORDER — DENOSUMAB 60 MG/ML ~~LOC~~ SOSY
60.0000 mg | PREFILLED_SYRINGE | Freq: Once | SUBCUTANEOUS | Status: AC
  Administered 2023-04-07: 60 mg via SUBCUTANEOUS
  Filled 2023-04-07: qty 1

## 2023-04-07 NOTE — Patient Instructions (Signed)
[facility]  Discharge Instructions: Thank you for choosing [facility] to provide your oncology and hematology care.  If you have a lab appointment with the Cancer Center - please note that after April 8th, 2024, all labs will be drawn in the cancer center.  You do not have to check in or register with the main entrance as you have in the past but will complete your check-in in the cancer center.  Wear comfortable clothing and clothing appropriate for easy access to any Portacath or PICC line.   We strive to give you quality time with your provider. You may need to reschedule your appointment if you arrive late (15 or more minutes).  Arriving late affects you and other patients whose appointments are after yours.  Also, if you miss three or more appointments without notifying the office, you may be dismissed from the clinic at the provider's discretion.      For prescription refill requests, have your pharmacy contact our office and allow 72 hours for refills to be completed.    Today you received the following prolia return as scheduled.   To help prevent nausea and vomiting after your treatment, we encourage you to take your nausea medication as directed.  BELOW ARE SYMPTOMS THAT SHOULD BE REPORTED IMMEDIATELY: *FEVER GREATER THAN 100.4 F (38 C) OR HIGHER *CHILLS OR SWEATING *NAUSEA AND VOMITING THAT IS NOT CONTROLLED WITH YOUR NAUSEA MEDICATION *UNUSUAL SHORTNESS OF BREATH *UNUSUAL BRUISING OR BLEEDING *URINARY PROBLEMS (pain or burning when urinating, or frequent urination) *BOWEL PROBLEMS (unusual diarrhea, constipation, pain near the anus) TENDERNESS IN MOUTH AND THROAT WITH OR WITHOUT PRESENCE OF ULCERS (sore throat, sores in mouth, or a toothache) UNUSUAL RASH, SWELLING OR PAIN  UNUSUAL VAGINAL DISCHARGE OR ITCHING   Items with * indicate a potential emergency and should be followed up as soon as possible or go to the Emergency Department if any  problems should occur.  Please show the CHEMOTHERAPY ALERT CARD or IMMUNOTHERAPY ALERT CARD at check-in to the Emergency Department and triage nurse.  Should you have questions after your visit or need to cancel or reschedule your appointment, please contact [facility] [phone]  and follow the prompts.  Office hours are 8:00 a.m. to 4:30 p.m. Monday - Friday. Please note that voicemails left after 4:00 p.m. may not be returned until the following business day.  We are closed weekends and major holidays. You have access to a nurse at all times for urgent questions. Please call the main number to the clinic [phone] and follow the prompts.  For any non-urgent questions, you may also contact your provider using MyChart. We now offer e-Visits for anyone 33 and older to request care online for non-urgent symptoms. For details visit [email or web address].   Also download the MyChart app! Go to the app store, search "MyChart", open the app, select [place], and log in with your MyChart username and password.

## 2023-04-07 NOTE — Progress Notes (Signed)
Patient taking calcium as directed. Denied tooth, jaw, and leg pain. No recent or upcoming dental visits. Labs reviewed. Patient tolerated injection with no complaints voiced. See MAR for details. Patient stable during and after injection. Site clean and dry with no bruising or swelling noted. Band aid applied. Vss with discharge and left in satisfactory condition with no s/s of distress.   

## 2023-04-10 ENCOUNTER — Other Ambulatory Visit: Payer: Self-pay | Admitting: Physician Assistant

## 2023-04-10 DIAGNOSIS — D6861 Antiphospholipid syndrome: Secondary | ICD-10-CM

## 2023-04-12 ENCOUNTER — Encounter (HOSPITAL_COMMUNITY): Payer: Self-pay | Admitting: Hematology

## 2023-04-15 ENCOUNTER — Other Ambulatory Visit: Payer: Self-pay | Admitting: Physician Assistant

## 2023-04-15 DIAGNOSIS — D6861 Antiphospholipid syndrome: Secondary | ICD-10-CM

## 2023-04-15 MED ORDER — WARFARIN SODIUM 3 MG PO TABS
3.0000 mg | ORAL_TABLET | Freq: Every day | ORAL | 6 refills | Status: DC
Start: 2023-04-15 — End: 2024-06-21

## 2023-04-19 NOTE — Patient Instructions (Signed)
Below is our plan:  We will continue topiramate 100mg  twice daily.   Please make sure you are consistent with timing of seizure medication. I recommend annual visit with primary care provider (PCP) for complete physical and routine blood work. I recommend daily intake of vitamin D (400-800iu) and calcium (800-1000mg ) for bone health. Discuss Dexa screening with PCP.   According to [place] law, you can not drive unless you are seizure / syncope free for at least 6 months and under physician's care.  Please maintain precautions. Do not participate in activities where a loss of awareness could harm you or someone else. No swimming alone, no tub bathing, no hot tubs, no driving, no operating motorized vehicles (cars, ATVs, motocycles, etc), lawnmowers, power tools or firearms. No standing at heights, such as rooftops, ladders or stairs. Avoid hot objects such as stoves, heaters, open fires. Wear a helmet when riding a bicycle, scooter, skateboard, etc. and avoid areas of traffic. Set your water heater to 120 degrees or less.  Please make sure you are staying well hydrated. I recommend 50-60 ounces daily. Well balanced diet and regular exercise encouraged. Consistent sleep schedule with 6-8 hours recommended.   Please continue follow up with care team as directed.   Follow up with me in 1 year   You may receive a survey regarding today's visit. I encourage you to leave honest feed back as I do use this information to improve patient care. Thank you for seeing me today!   Management of Memory Problems   There are some general things you can do to help manage your memory problems.  Your memory may not in fact recover, but by using techniques and strategies you will be able to manage your memory difficulties better.   1)  Establish a routine. Try to establish and then stick to a regular routine.  By doing this, you will get used to what to expect and you will reduce the need to rely on your memory.  Also,  try to do things at the same time of day, such as taking your medication or checking your calendar first thing in the morning. Think about think that you can do as a part of a regular routine and make a list.  Then enter them into a daily planner to remind you.  This will help you establish a routine.   2)  Organize your environment. Organize your environment so that it is uncluttered.  Decrease visual stimulation.  Place everyday items such as keys or cell phone in the same place every day (ie.  Basket next to front door) Use post it notes with a brief message to yourself (ie. Turn off light, lock the door) Use labels to indicate where things go (ie. Which cupboards are for food, dishes, etc.) Keep a notepad and pen by the telephone to take messages   3)  Memory Aids A diary or journal/notebook/daily planner Making a list (shopping list, chore list, to do list that needs to be done) Using an alarm as a reminder (kitchen timer or cell phone alarm) Using cell phone to store information (Notes, Calendar, Reminders) Calendar/White board placed in a prominent position Post-it notes   In order for memory aids to be useful, you need to have good habits.  It's no good remembering to make a note in your journal if you don't remember to look in it.  Try setting aside a certain time of day to look in journal.   4)  Improving  mood and managing fatigue. There may be other factors that contribute to memory difficulties.  Factors, such as anxiety, depression and tiredness can affect memory. Regular gentle exercise can help improve your mood and give you more energy. Exercise: there are short videos created by the [employer] on Health specially for older adults: https://bit.ly/2I30q97.  Mediterranean diet: which emphasizes fruits, vegetables, whole grains, legumes, fish, and other seafood; unsaturated fats such as olive oils; and low amounts of red meat, eggs, and sweets. A variation of this, called  MIND (Mediterranean-DASH Intervention for Neurodegenerative Delay) incorporates the DASH (Dietary Approaches to Stop Hypertension) diet, which has been shown to lower high blood pressure, a risk factor for Alzheimer's disease. More information at: ExitMarketing.de.  Aerobic exercise that improve heart health is also good for the mind.  [employer] on Aging have short videos for exercises that you can do at home: BlindWorkshop.com.pt Simple relaxation techniques may help relieve symptoms of anxiety Try to get back to completing activities or hobbies you enjoyed doing in the past. Learn to pace yourself through activities to decrease fatigue. Find out about some local support groups where you can share experiences with others. Try and achieve 7-8 hours of sleep at night.   GENERAL HEADACHE INFORMATION:   Natural supplements: Magnesium Oxide or Magnesium Glycinate 500 mg at bed (up to 800 mg daily) Coenzyme Q10 300 mg in AM Vitamin B2- 200 mg twice a day   Add 1 supplement at a time since even natural supplements can have undesirable side effects. You can sometimes buy supplements cheaper (especially Coenzyme Q10) at www.WebmailGuide.co.za or at [employer].   Vitamins and herbs that show potential:   Magnesium: Magnesium (250 mg twice a day or 500 mg at bed) has a relaxant effect on smooth muscles such as blood vessels. Individuals suffering from frequent or daily headache usually have low magnesium levels which can be increase with daily supplementation of 400-750 mg. Three trials found 40-90% average headache reduction  when used as a preventative. Magnesium also demonstrated the benefit in menstrually related migraine.  Magnesium is part of the messenger system in the serotonin cascade and it is a good muscle relaxant.  It is also useful for constipation which can be a side effect of other medications used to treat  migraine. Good sources include nuts, whole grains, and tomatoes. Side Effects: loose stool/diarrhea  Riboflavin (vitamin B 2) 200 mg twice a day. This vitamin assists nerve cells in the production of ATP a principal energy storing molecule.  It is necessary for many chemical reactions in the body.  There have been at least 3 clinical trials of riboflavin using 400 mg per day all of which suggested that migraine frequency can be decreased.  All 3 trials showed significant improvement in over half of migraine sufferers.  The supplement is found in bread, cereal, milk, meat, and poultry.  Most Americans get more riboflavin than the recommended daily allowance, however riboflavin deficiency is not necessary for the supplements to help prevent headache. Side effects: energizing, green urine   Coenzyme Q10: This is present in almost all cells in the body and is critical component for the conversion of energy.  Recent studies have shown that a nutritional supplement of CoQ10 can reduce the frequency of migraine attacks by improving the energy production of cells as with riboflavin.  Doses of 150 mg twice a day have been shown to be effective.   Melatonin: Increasing evidence shows correlation between melatonin secretion and headache conditions.  Melatonin supplementation has decreased headache intensity and duration.  It is widely used as a sleep aid.  Sleep is natures way of dealing with migraine.  A dose of 3 mg is recommended to start for headaches including cluster headache. Higher doses up to 15 mg has been reviewed for use in Cluster headache and have been used. The rationale behind using melatonin for cluster is that many theories regarding the cause of Cluster headache center around the disruption of the normal circadian rhythm in the brain.  This helps restore the normal circadian rhythm.   HEADACHE DIET: Foods and beverages which may trigger migraine Note that only 20% of headache patients are food  sensitive. You will know if you are food sensitive if you get a headache consistently 20 minutes to 2 hours after eating a certain food. Only cut out a food if it causes headaches, otherwise you might remove foods you enjoy! What matters most for diet is to eat a well balanced healthy diet full of vegetables and low fat protein, and to not miss meals.   Chocolate, other sweets ALL cheeses except cottage and cream cheese Dairy products, yogurt, sour cream, ice cream Liver Meat extracts (Bovril, Marmite, meat tenderizers) Meats or fish which have undergone aging, fermenting, pickling or smoking. These include: Hotdogs,salami,Lox,sausage, mortadellas,smoked salmon, pepperoni, Pickled herring Pods of broad bean (English beans, Chinese pea pods, [place] (fava) beans, lima and navy beans Ripe avocado, ripe banana Yeast extracts or active yeast preparations such as Brewer's or Fleishman's (commercial bakes goods are permitted) Tomato based foods, pizza (lasagna, etc.)   MSG (monosodium glutamate) is disguised as many things; look for these common aliases: Monopotassium glutamate Autolysed yeast Hydrolysed protein Sodium caseinate "flavorings" "all natural preservatives" Nutrasweet   Avoid all other foods that convincingly provoke headaches.   Resources: The Dizzy [place] Laundry Your Headache Diet, migrainestrong.com  https://zamora-andrews.com/   Caffeine and Migraine For patients that have migraine, caffeine intake more than 3 days per week can lead to dependency and increased migraine frequency. I would recommend cutting back on your caffeine intake as best you can. The recommended amount of caffeine is 200-300 mg daily, although migraine patients may experience dependency at even lower doses. While you may notice an increase in headache temporarily, cutting back will be helpful for headaches in the long run. For more information on caffeine and  migraine, visit: https://americanmigrainefoundation.org/resource-library/caffeine-and-migraine/   Headache Prevention Strategies:   1. Maintain a headache diary; learn to identify and avoid triggers.  - This can be a simple note where you log when you had a headache, associated symptoms, and medications used - There are several smartphone apps developed to help track migraines: Migraine Buddy, Migraine Monitor, Curelator N1-Headache App   Common triggers include: Emotional triggers: Emotional/Upset family or friends Emotional/Upset occupation Business reversal/success Anticipation anxiety Crisis-serious Post-crisis periodNew job/position   Physical triggers: Vacation Day Weekend Strenuous Exercise High Altitude Location New Move Menstrual Day Physical Illness Oversleep/Not enough sleep Weather changes Light: Photophobia or light sesnitivity treatment involves a balance between desensitization and reduction in overly strong input. Use dark polarized glasses outside, but not inside. Avoid bright or fluorescent light, but do not dim environment to the point that going into a normally lit room hurts. Consider FL-41 tint lenses, which reduce the most irritating wavelengths without blocking too much light.  These can be obtained at axonoptics.com or theraspecs.com Foods: see list above.   2. Limit use of acute treatments (over-the-counter medications, triptans, etc.) to no more than  2 days per week or 10 days per month to prevent medication overuse headache (rebound headache).     3. Follow a regular schedule (including weekends and holidays): Don't skip meals. Eat a balanced diet. 8 hours of sleep nightly. Minimize stress. Exercise 30 minutes per day. Being overweight is associated with a 5 times increased risk of chronic migraine. Keep well hydrated and drink 6-8 glasses of water per day.   4. Initiate non-pharmacologic measures at the earliest onset of your headache. Rest and  quiet environment. Relax and reduce stress. Breathe2Relax is a free app that can instruct you on    some simple relaxtion and breathing techniques. Http://Dawnbuse.com is a    free website that provides teaching videos on relaxation.  Also, there are  many apps that   can be downloaded for "mindful" relaxation.  An app called YOGA NIDRA will help walk you through mindfulness. Another app called Calm can be downloaded to give you a structured mindfulness guide with daily reminders and skill development. Headspace for guided meditation Mindfulness Based Stress Reduction Online Course: www.palousemindfulness.com Cold compresses.   5. Don't wait!! Take the maximum allowable dosage of prescribed medication at the first sign of migraine.   6. Compliance:  Take prescribed medication regularly as directed and at the first sign of a migraine.   7. Communicate:  Call your physician when problems arise, especially if your headaches change, increase in frequency/severity, or become associated with neurological symptoms (weakness, numbness, slurred speech, etc.).   8. Headache/pain management therapies: Consider various complementary methods, including medication, behavioral therapy, psychological counselling, biofeedback, massage therapy, acupuncture, dry needling, and other modalities.  Such measures may reduce the need for medications. Counseling for pain management, where patients learn to function and ignore/minimize their pain, seems to work very well.   9. Recommend changing family's attention and focus away from patient's headaches. Instead, emphasize daily activities. If first question of day is 'How are your headaches/Do you have a headache today?', then patient will constantly think about headaches, thus making them worse. Goal is to re-direct attention away from headaches, toward daily activities and other distractions.   10. Helpful  Websites: www.AmericanHeadacheSociety.org PatentHood.ch www.headaches.org TightMarket.nl www.achenet.org
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
## 2023-04-20 ENCOUNTER — Encounter: Payer: Self-pay | Admitting: Family Medicine

## 2023-04-20 ENCOUNTER — Ambulatory Visit: Payer: BC Managed Care – PPO | Admitting: Family Medicine

## 2023-04-20 VITALS — BP 95/65 | HR 85 | Ht 64.0 in | Wt 124.5 lb

## 2023-04-20 DIAGNOSIS — G43709 Chronic migraine without aura, not intractable, without status migrainosus: Secondary | ICD-10-CM

## 2023-04-20 DIAGNOSIS — G40301 Generalized idiopathic epilepsy and epileptic syndromes, not intractable, with status epilepticus: Secondary | ICD-10-CM | POA: Diagnosis not present

## 2023-04-20 MED ORDER — TOPIRAMATE 100 MG PO TABS: 100.0000 mg | ORAL_TABLET | Freq: Two times a day (BID) | ORAL | 3 refills | Status: DC

## 2023-05-17 ENCOUNTER — Ambulatory Visit: Payer: BC Managed Care – PPO | Admitting: Family Medicine

## 2023-07-15 ENCOUNTER — Other Ambulatory Visit (HOSPITAL_COMMUNITY): Payer: Self-pay | Admitting: Internal Medicine

## 2023-07-15 DIAGNOSIS — Z1231 Encounter for screening mammogram for malignant neoplasm of breast: Secondary | ICD-10-CM

## 2023-07-19 ENCOUNTER — Ambulatory Visit (HOSPITAL_COMMUNITY)
Admission: RE | Admit: 2023-07-19 | Discharge: 2023-07-19 | Disposition: A | Discharge status: Home / Self Care | Payer: BC Managed Care – PPO | Source: Ambulatory Visit | Attending: Internal Medicine | Admitting: Internal Medicine

## 2023-07-19 DIAGNOSIS — Z1231 Encounter for screening mammogram for malignant neoplasm of breast: Secondary | ICD-10-CM | POA: Diagnosis not present

## 2023-07-22 ENCOUNTER — Ambulatory Visit (HOSPITAL_COMMUNITY): Payer: BC Managed Care – PPO

## 2023-10-11 ENCOUNTER — Telehealth: Payer: Self-pay | Admitting: Family Medicine

## 2023-10-11 NOTE — Telephone Encounter (Signed)
Samantha Fields at [facility] in [place] called wanting to know if an early refill approval can be authorized for the topiramate (TOPAMAX) 100 MG tablet. Pt refilled on Aug 11th 180qt for 90 supply. Pt states she has run out and [name] is wanting to know if the instructions have been changed and if not if the early refill can be authorized. Pt is supposed to refill on Nov 6th.

## 2023-10-11 NOTE — Telephone Encounter (Signed)
Called and spoke with Samantha Fields. Advised ok to refill early per AL,NP. She verbalized understanding.

## 2023-10-11 NOTE — Telephone Encounter (Signed)
Amy - ok for early refill?

## 2023-11-13 ENCOUNTER — Other Ambulatory Visit: Payer: Self-pay | Admitting: Adult Health

## 2023-12-13 ENCOUNTER — Encounter (HOSPITAL_COMMUNITY): Payer: Self-pay | Admitting: Hematology

## 2023-12-24 ENCOUNTER — Other Ambulatory Visit (HOSPITAL_COMMUNITY): Payer: Self-pay | Admitting: Internal Medicine

## 2023-12-24 DIAGNOSIS — M810 Age-related osteoporosis without current pathological fracture: Secondary | ICD-10-CM

## 2024-01-27 ENCOUNTER — Other Ambulatory Visit (HOSPITAL_COMMUNITY): Payer: Self-pay

## 2024-01-27 ENCOUNTER — Encounter (HOSPITAL_COMMUNITY): Payer: Self-pay | Admitting: Hematology

## 2024-02-11 ENCOUNTER — Other Ambulatory Visit: Payer: Self-pay | Admitting: Adult Health

## 2024-02-11 ENCOUNTER — Other Ambulatory Visit: Payer: Self-pay | Admitting: Physician Assistant

## 2024-02-11 DIAGNOSIS — E611 Iron deficiency: Secondary | ICD-10-CM

## 2024-02-17 ENCOUNTER — Other Ambulatory Visit: Payer: Self-pay

## 2024-02-17 DIAGNOSIS — E538 Deficiency of other specified B group vitamins: Secondary | ICD-10-CM

## 2024-02-18 ENCOUNTER — Inpatient Hospital Stay: Payer: BC Managed Care – PPO | Attending: Hematology

## 2024-02-18 DIAGNOSIS — M810 Age-related osteoporosis without current pathological fracture: Secondary | ICD-10-CM | POA: Insufficient documentation

## 2024-02-18 DIAGNOSIS — D6861 Antiphospholipid syndrome: Secondary | ICD-10-CM | POA: Insufficient documentation

## 2024-02-18 DIAGNOSIS — Z79899 Other long term (current) drug therapy: Secondary | ICD-10-CM | POA: Diagnosis not present

## 2024-02-18 DIAGNOSIS — Z8249 Family history of ischemic heart disease and other diseases of the circulatory system: Secondary | ICD-10-CM | POA: Insufficient documentation

## 2024-02-18 DIAGNOSIS — G2581 Restless legs syndrome: Secondary | ICD-10-CM | POA: Insufficient documentation

## 2024-02-18 DIAGNOSIS — Z8261 Family history of arthritis: Secondary | ICD-10-CM | POA: Diagnosis not present

## 2024-02-18 DIAGNOSIS — Z818 Family history of other mental and behavioral disorders: Secondary | ICD-10-CM | POA: Diagnosis not present

## 2024-02-18 DIAGNOSIS — Z7901 Long term (current) use of anticoagulants: Secondary | ICD-10-CM | POA: Insufficient documentation

## 2024-02-18 DIAGNOSIS — E538 Deficiency of other specified B group vitamins: Secondary | ICD-10-CM | POA: Insufficient documentation

## 2024-02-18 DIAGNOSIS — K30 Functional dyspepsia: Secondary | ICD-10-CM | POA: Diagnosis not present

## 2024-02-18 DIAGNOSIS — E611 Iron deficiency: Secondary | ICD-10-CM | POA: Insufficient documentation

## 2024-02-18 DIAGNOSIS — I82403 Acute embolism and thrombosis of unspecified deep veins of lower extremity, bilateral: Secondary | ICD-10-CM | POA: Insufficient documentation

## 2024-02-18 DIAGNOSIS — Z90710 Acquired absence of both cervix and uterus: Secondary | ICD-10-CM | POA: Diagnosis not present

## 2024-02-18 DIAGNOSIS — F17210 Nicotine dependence, cigarettes, uncomplicated: Secondary | ICD-10-CM | POA: Diagnosis not present

## 2024-02-18 DIAGNOSIS — Z833 Family history of diabetes mellitus: Secondary | ICD-10-CM | POA: Diagnosis not present

## 2024-02-18 DIAGNOSIS — D509 Iron deficiency anemia, unspecified: Secondary | ICD-10-CM

## 2024-02-18 LAB — CBC WITH DIFFERENTIAL/PLATELET
Abs Immature Granulocytes: 0.03 10*3/uL (ref 0.00–0.07)
Basophils Absolute: 0.1 10*3/uL (ref 0.0–0.1)
Basophils Relative: 1 %
Eosinophils Absolute: 0.1 10*3/uL (ref 0.0–0.5)
Eosinophils Relative: 1 %
HCT: 44.6 % (ref 36.0–46.0)
Hemoglobin: 14 g/dL (ref 12.0–15.0)
Immature Granulocytes: 0 %
Lymphocytes Relative: 18 %
Lymphs Abs: 1.3 10*3/uL (ref 0.7–4.0)
MCH: 32 pg (ref 26.0–34.0)
MCHC: 31.4 g/dL (ref 30.0–36.0)
MCV: 102.1 fL — ABNORMAL HIGH (ref 80.0–100.0)
Monocytes Absolute: 0.8 10*3/uL (ref 0.1–1.0)
Monocytes Relative: 11 %
Neutro Abs: 4.9 10*3/uL (ref 1.7–7.7)
Neutrophils Relative %: 69 %
Platelets: 187 10*3/uL (ref 150–400)
RBC: 4.37 MIL/uL (ref 3.87–5.11)
RDW: 12.8 % (ref 11.5–15.5)
WBC: 7.2 10*3/uL (ref 4.0–10.5)
nRBC: 0 % (ref 0.0–0.2)

## 2024-02-18 LAB — COMPREHENSIVE METABOLIC PANEL
ALT: 23 U/L (ref 0–44)
AST: 23 U/L (ref 15–41)
Albumin: 4.2 g/dL (ref 3.5–5.0)
Alkaline Phosphatase: 72 U/L (ref 38–126)
Anion gap: 11 (ref 5–15)
BUN: 15 mg/dL (ref 8–23)
CO2: 24 mmol/L (ref 22–32)
Calcium: 10.5 mg/dL — ABNORMAL HIGH (ref 8.9–10.3)
Chloride: 106 mmol/L (ref 98–111)
Creatinine, Ser: 1.08 mg/dL — ABNORMAL HIGH (ref 0.44–1.00)
GFR, Estimated: 58 mL/min — ABNORMAL LOW (ref >60)
Glucose, Bld: 69 mg/dL — ABNORMAL LOW (ref 70–99)
Potassium: 4.1 mmol/L (ref 3.5–5.1)
Sodium: 141 mmol/L (ref 135–145)
Total Bilirubin: 0.5 mg/dL (ref 0.0–1.2)
Total Protein: 6.9 g/dL (ref 6.5–8.1)

## 2024-02-18 LAB — IRON AND TIBC
Iron: 88 ug/dL (ref 28–170)
Saturation Ratios: 23 % (ref 10.4–31.8)
TIBC: 386 ug/dL (ref 250–450)
UIBC: 298 ug/dL

## 2024-02-18 LAB — FOLATE: Folate: 12.6 ng/mL (ref >5.9)

## 2024-02-18 LAB — FERRITIN: Ferritin: 181 ng/mL (ref 11–307)

## 2024-02-21 LAB — METHYLMALONIC ACID, SERUM: Methylmalonic Acid, Quantitative: 236 nmol/L (ref 0–378)

## 2024-02-25 ENCOUNTER — Inpatient Hospital Stay: Payer: BC Managed Care – PPO | Admitting: Oncology

## 2024-02-25 VITALS — BP 96/66 | HR 70 | Temp 98.1°F | Resp 16 | Wt 133.8 lb

## 2024-02-25 DIAGNOSIS — D509 Iron deficiency anemia, unspecified: Secondary | ICD-10-CM

## 2024-02-25 DIAGNOSIS — E538 Deficiency of other specified B group vitamins: Secondary | ICD-10-CM | POA: Diagnosis not present

## 2024-02-25 DIAGNOSIS — E611 Iron deficiency: Secondary | ICD-10-CM

## 2024-02-25 DIAGNOSIS — M810 Age-related osteoporosis without current pathological fracture: Secondary | ICD-10-CM

## 2024-02-25 DIAGNOSIS — D6861 Antiphospholipid syndrome: Secondary | ICD-10-CM | POA: Diagnosis not present
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
## 2024-02-28 ENCOUNTER — Ambulatory Visit: Payer: Self-pay | Admitting: Adult Health

## 2024-02-28 ENCOUNTER — Encounter: Payer: Self-pay | Admitting: Adult Health

## 2024-02-28 VITALS — BP 106/70 | HR 52 | Ht 64.0 in | Wt 133.5 lb

## 2024-02-28 DIAGNOSIS — F419 Anxiety disorder, unspecified: Secondary | ICD-10-CM

## 2024-02-28 DIAGNOSIS — Z1331 Encounter for screening for depression: Secondary | ICD-10-CM | POA: Diagnosis not present

## 2024-02-28 DIAGNOSIS — Z01419 Encounter for gynecological examination (general) (routine) without abnormal findings: Secondary | ICD-10-CM

## 2024-02-28 DIAGNOSIS — Z86718 Personal history of other venous thrombosis and embolism: Secondary | ICD-10-CM

## 2024-02-28 DIAGNOSIS — Z90710 Acquired absence of both cervix and uterus: Secondary | ICD-10-CM | POA: Diagnosis not present

## 2024-02-28 MED ORDER — CITALOPRAM HYDROBROMIDE 40 MG PO TABS: 40.0000 mg | ORAL_TABLET | Freq: Every day | ORAL | 4 refills | Status: AC

## 2024-02-28 NOTE — Progress Notes (Signed)
 Patient ID: Samantha Fields, female   DOB: 05-10-1961, 63 y.o.   MRN: [id number] History of Present Illness: Samantha Fields is a 63 year old white female, married, sp hysterectomy in foir well woman gyn exam.  PCP is Dr Margo Aye.    Current Medications, Allergies, Past Medical History, Past Surgical History, Family History and Social History were reviewed in [employer] record.     Review of Systems: Skin warm and dry. Neck: mid line trachea, normal thyroid, good ROM, no lymphadenopathy noted. Lungs: clear to ausculation bilaterally. Cardiovascular: regular rate and rhythm.  She has lost over 50 lbs, was taking wegovy.   Physical Exam:BP 106/70 (BP Location: Left Arm, Patient Position: Sitting, Cuff Size: Normal)   Pulse (!) 52   Ht 5\' 4"  (1.626 m)   Wt 133 lb 8 oz (60.6 kg)   BMI 22.92 kg/m   General:  Well developed, well nourished, no acute distress Skin:  Warm and dry Neck:  Midline trachea, normal thyroid, good ROM, no lymphadenopathy,no carotid bruits heard Lungs; Clear to auscultation bilaterally Breast:  No dominant palpable mass, retraction, or nipple discharge Cardiovascular: Regular rate and rhythm Abdomen:  Soft, non tender, no hepatosplenomegaly Pelvic:  External genitalia is normal in appearance, no lesions.  The vagina is pale. Urethra has no lesions or masses. The cervix and uterus are absent.  No adnexal masses or tenderness noted.Bladder is non tender, no masses felt. Rectal:Deferred  Extremities/musculoskeletal:  No swelling or varicosities noted, no clubbing or cyanosis Psych:  No mood changes, alert and cooperative,seems happy AA is 1 Fall risk is low    02/28/2024   11:59 AM 07/08/2020   10:22 AM 08/16/2018    8:44 AM  Depression screen PHQ 2/9  Decreased Interest 0 0 0  Down, Depressed, Hopeless 0 0 0  PHQ - 2 Score 0 0 0  Altered sleeping 0 0   Tired, decreased energy 1 0   Change in appetite 0 0   Feeling bad or failure about yourself  0 0    Trouble concentrating 0 0   Moving slowly or fidgety/restless 0 0   Suicidal thoughts 0 0   PHQ-9 Score 1 0   Difficult doing work/chores  Not difficult at all        02/28/2024   11:59 AM 07/08/2020   10:22 AM  GAD 7 : Generalized Anxiety Score  Nervous, Anxious, on Edge 1 0  Control/stop worrying 1 0  Worry too much - different things 1 0  Trouble relaxing 1 2  Restless 1 2  Easily annoyed or irritable 0 2  Afraid - awful might happen 0 2  Total GAD 7 Score 5 8  Anxiety Difficulty  Not difficult at all      Upstream - 02/28/24 1210       Pregnancy Intention Screening   Does the patient want to become pregnant in the next year? N/A    Does the patient's partner want to become pregnant in the next year? N/A    Would the patient like to discuss contraceptive options today? N/A      Contraception Wrap Up   Current Method Female Sterilization   hyst   End Method Female Sterilization   hyst   Contraception Counseling Provided No            Examination chaperoned by Malachy Mood LPN   Impression and plan: 1. Encounter for well woman exam with routine gynecological exam (Primary) Physical in 2  years Labs with PCP Mammogram was negative 07/19/23 Colonoscopy per GI  Stays active, keeps grandkids   2. S/P hysterectomy  3. Anxiety Doing well Will refill celexa 40 mg  Meds ordered this encounter  Medications   citalopram (CELEXA) 40 MG tablet    Sig: Take 1 tablet (40 mg total) by mouth daily.    Dispense:  90 tablet    Refill:  4    Supervising Provider:   Duane Lope H [[id number]]     4. History of DVT (deep vein thrombosis) On coumadin, sees Dr Kirtland Bouchard

## 2024-03-02 ENCOUNTER — Other Ambulatory Visit: Payer: Self-pay | Admitting: Internal Medicine

## 2024-03-03 ENCOUNTER — Inpatient Hospital Stay

## 2024-03-20 ENCOUNTER — Other Ambulatory Visit: Payer: Self-pay | Admitting: *Deleted

## 2024-03-20 MED ORDER — TOPIRAMATE 100 MG PO TABS: 100.0000 mg | ORAL_TABLET | Freq: Two times a day (BID) | ORAL | 0 refills | Status: DC

## 2024-03-23 ENCOUNTER — Other Ambulatory Visit: Payer: Self-pay

## 2024-04-03 ENCOUNTER — Telehealth: Payer: Self-pay

## 2024-04-03 NOTE — Telephone Encounter (Signed)
 Auth Submission: APPROVED Site of care: Site of care: [facility] Payer: aetna state  Medication & CPT/J Code(s) submitted: Prolia  (Denosumab ) [id number] Route of submission (phone, fax, portal): portal Phone # Fax # Auth type: Buy/Bill PB Units/visits requested: 60mg , q28months x 2doses Reference number: [id number] Approval from: 03/24/24 to 03/23/25

## 2024-04-06 ENCOUNTER — Other Ambulatory Visit: Payer: Self-pay | Admitting: *Deleted

## 2024-04-06 DIAGNOSIS — E876 Hypokalemia: Secondary | ICD-10-CM

## 2024-04-06 MED ORDER — POTASSIUM CHLORIDE CRYS ER 20 MEQ PO TBCR: 40.0000 meq | EXTENDED_RELEASE_TABLET | Freq: Two times a day (BID) | ORAL | 6 refills | Status: DC

## 2024-04-10 ENCOUNTER — Encounter (HOSPITAL_COMMUNITY): Payer: Self-pay | Admitting: Hematology

## 2024-04-10 ENCOUNTER — Encounter: Payer: Self-pay | Admitting: Internal Medicine

## 2024-04-12 ENCOUNTER — Encounter: Attending: Internal Medicine | Admitting: Emergency Medicine

## 2024-04-12 VITALS — BP 100/69 | HR 76 | Temp 98.2°F | Resp 16

## 2024-04-12 DIAGNOSIS — M810 Age-related osteoporosis without current pathological fracture: Secondary | ICD-10-CM | POA: Diagnosis not present

## 2024-04-12 MED ORDER — DENOSUMAB 60 MG/ML ~~LOC~~ SOSY
60.0000 mg | PREFILLED_SYRINGE | Freq: Once | SUBCUTANEOUS | Status: AC
  Administered 2024-04-12: 60 mg via SUBCUTANEOUS

## 2024-04-12 NOTE — Progress Notes (Signed)
 Diagnosis: Osteoporosis  Provider:  Izetta Marshall MD  Procedure: Injection  Prolia (Denosumab), Dose: 60 mg, Site: subcutaneous, Number of injections: 1  Injection Site(s): Right arm  Post Care: Patient declined observation  Discharge: Condition: Good, Destination: Home . AVS Provided  Performed by:  Arlina Benjamin, RN
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
## 2024-04-20 NOTE — Patient Instructions (Signed)
 Below is our plan:  We will continue topiramate  100mg  twice daily   Please make sure you are consistent with timing of seizure medication. I recommend annual visit with primary care provider (PCP) for complete physical and routine blood work. I recommend daily intake of vitamin D  (400-800iu) and calcium (800-1000mg ) for bone health. Discuss Dexa screening with PCP.   According to [place] law, you can not drive unless you are seizure / syncope free for at least 6 months and under physician's care.  Please maintain precautions. Do not participate in activities where a loss of awareness could harm you or someone else. No swimming alone, no tub bathing, no hot tubs, no driving, no operating motorized vehicles (cars, ATVs, motocycles, etc), lawnmowers, power tools or firearms. No standing at heights, such as rooftops, ladders or stairs. Avoid hot objects such as stoves, heaters, open fires. Wear a helmet when riding a bicycle, scooter, skateboard, etc. and avoid areas of traffic. Set your water heater to 120 degrees or less.  SUDEP is the sudden, unexpected death of someone with epilepsy, who was otherwise healthy. In SUDEP cases, no other cause of death is found when an autopsy is done. Each year, more than 1 in 1,000 people with epilepsy die from SUDEP. This is the leading cause of death in people with uncontrolled seizures. Until further answers are available, the best way to prevent SUDEP is to lower your risk by controlling seizures. Research has found that people with all types of epilepsy that experience convulsive seizures can be at risk.  Please make sure you are staying well hydrated. I recommend 50-60 ounces daily. Well balanced diet and regular exercise encouraged. Consistent sleep schedule with 6-8 hours recommended.   Please continue follow up with care team as directed.   Follow up with me in 1 year   You may receive a survey regarding today's visit. I encourage you to leave honest feed back  as I do use this information to improve patient care. Thank you for seeing me today!

## 2024-04-25 ENCOUNTER — Encounter: Payer: Self-pay | Admitting: Family Medicine

## 2024-04-25 ENCOUNTER — Encounter (HOSPITAL_COMMUNITY): Payer: Self-pay | Admitting: Hematology

## 2024-04-25 ENCOUNTER — Encounter: Payer: Self-pay | Admitting: Internal Medicine

## 2024-04-25 ENCOUNTER — Ambulatory Visit: Payer: BC Managed Care – PPO | Admitting: Family Medicine

## 2024-04-25 VITALS — BP 112/73 | HR 74 | Ht 64.0 in | Wt 136.5 lb

## 2024-04-25 DIAGNOSIS — G43709 Chronic migraine without aura, not intractable, without status migrainosus: Secondary | ICD-10-CM

## 2024-04-25 DIAGNOSIS — G40301 Generalized idiopathic epilepsy and epileptic syndromes, not intractable, with status epilepticus: Secondary | ICD-10-CM

## 2024-04-25 MED ORDER — TOPIRAMATE 100 MG PO TABS: 100.0000 mg | ORAL_TABLET | Freq: Two times a day (BID) | ORAL | 3 refills | Status: AC

## 2024-05-15 ENCOUNTER — Other Ambulatory Visit: Payer: Self-pay | Admitting: Physician Assistant

## 2024-05-15 DIAGNOSIS — E611 Iron deficiency: Secondary | ICD-10-CM

## 2024-06-11 ENCOUNTER — Other Ambulatory Visit: Payer: Self-pay | Admitting: Physician Assistant

## 2024-06-11 DIAGNOSIS — D6861 Antiphospholipid syndrome: Secondary | ICD-10-CM

## 2024-06-16 LAB — LAB REPORT - SCANNED: EGFR: 70

## 2024-06-17 ENCOUNTER — Other Ambulatory Visit: Payer: Self-pay | Admitting: Physician Assistant

## 2024-06-17 DIAGNOSIS — D6861 Antiphospholipid syndrome: Secondary | ICD-10-CM

## 2024-06-19 ENCOUNTER — Encounter: Payer: Self-pay | Admitting: Internal Medicine

## 2024-06-19 ENCOUNTER — Encounter (HOSPITAL_COMMUNITY): Payer: Self-pay | Admitting: Hematology

## 2024-06-21 ENCOUNTER — Other Ambulatory Visit: Payer: Self-pay | Admitting: *Deleted

## 2024-06-21 ENCOUNTER — Other Ambulatory Visit: Payer: Self-pay | Admitting: Physician Assistant

## 2024-06-21 DIAGNOSIS — D6861 Antiphospholipid syndrome: Secondary | ICD-10-CM

## 2024-06-21 MED ORDER — WARFARIN SODIUM 3 MG PO TABS: 3.0000 mg | ORAL_TABLET | Freq: Every day | ORAL | 6 refills | Status: AC

## 2024-07-26 ENCOUNTER — Other Ambulatory Visit: Payer: Self-pay

## 2024-08-01 ENCOUNTER — Other Ambulatory Visit (HOSPITAL_COMMUNITY)

## 2024-08-07 ENCOUNTER — Ambulatory Visit (HOSPITAL_COMMUNITY)
Admission: RE | Admit: 2024-08-07 | Discharge: 2024-08-07 | Disposition: A | Discharge status: Home / Self Care | Source: Ambulatory Visit | Attending: Internal Medicine | Admitting: Internal Medicine

## 2024-08-07 DIAGNOSIS — M810 Age-related osteoporosis without current pathological fracture: Secondary | ICD-10-CM | POA: Diagnosis present

## 2024-08-10 ENCOUNTER — Telehealth: Payer: Self-pay

## 2024-08-10 ENCOUNTER — Other Ambulatory Visit: Payer: Self-pay | Admitting: Internal Medicine

## 2024-08-10 NOTE — Telephone Encounter (Signed)
 Auth Submission: NO AUTH NEEDED Site of care: Site of care: [facility] Payer: AETNA [place] Medication & CPT/J Code(s) submitted: Evenity (Romosozumab) [id number] Diagnosis Code:  Route of submission (phone, fax, portal): PORTAL Phone # Fax # Auth type: Buy/Bill PB Units/visits requested: 210mg , q4weeks x 12doses Reference number:  Approval from: 08/10/24 to 12/13/24

## 2024-08-13 ENCOUNTER — Other Ambulatory Visit: Payer: Self-pay | Admitting: Physician Assistant

## 2024-08-13 DIAGNOSIS — E611 Iron deficiency: Secondary | ICD-10-CM

## 2024-08-14 ENCOUNTER — Encounter: Payer: Self-pay | Admitting: Internal Medicine

## 2024-08-23 ENCOUNTER — Encounter: Attending: Internal Medicine | Admitting: Emergency Medicine

## 2024-08-23 VITALS — BP 98/66 | HR 76 | Temp 97.9°F | Resp 15

## 2024-08-23 DIAGNOSIS — M810 Age-related osteoporosis without current pathological fracture: Secondary | ICD-10-CM | POA: Diagnosis not present

## 2024-08-23 MED ORDER — ROMOSOZUMAB-AQQG 105 MG/1.17ML ~~LOC~~ SOSY
210.0000 mg | PREFILLED_SYRINGE | Freq: Once | SUBCUTANEOUS | Status: AC
  Administered 2024-08-23: 210 mg via SUBCUTANEOUS

## 2024-08-23 NOTE — Progress Notes (Signed)
 Diagnosis: Osteoporosis  Provider:  Shona Salvo MD  Procedure: Injection  Evenity  (Romosozumab -aqqg), Dose: 210 mg, Site: subcutaneous, Number of injections: 2  Injection Site(s): Left lower quad. abdomen and Right lower quad. abdomne  Post Care: Observation period completed  Discharge: Condition: Good, Destination: Home . AVS Declined  Performed by:  Delon ONEIDA Officer, RN

## 2024-09-05 ENCOUNTER — Inpatient Hospital Stay

## 2024-09-20 ENCOUNTER — Ambulatory Visit

## 2024-09-20 ENCOUNTER — Encounter: Attending: Internal Medicine | Admitting: Emergency Medicine

## 2024-09-20 VITALS — BP 112/74 | HR 74 | Temp 97.6°F | Resp 16

## 2024-09-20 DIAGNOSIS — M810 Age-related osteoporosis without current pathological fracture: Secondary | ICD-10-CM | POA: Diagnosis not present

## 2024-09-20 MED ORDER — ROMOSOZUMAB-AQQG 105 MG/1.17ML ~~LOC~~ SOSY
210.0000 mg | PREFILLED_SYRINGE | Freq: Once | SUBCUTANEOUS | Status: AC
  Administered 2024-09-20: 210 mg via SUBCUTANEOUS

## 2024-09-20 NOTE — Progress Notes (Signed)
 Diagnosis: Osteoporosis  Provider:  Izetta Marshall MD  Procedure: Injection  Evenity  (Romosozumab -aqqg), Dose: 210 mg, Site: subcutaneous, Number of injections: 2  Injection Site(s): Left lower quad. abdomen and Right lower quad. abdomne  Post Care: Patient declined observation  Discharge: Condition: Good, Destination: Home . AVS Provided  Performed by:  Arlina Benjamin, RN

## 2024-09-21 ENCOUNTER — Ambulatory Visit

## 2024-10-09 ENCOUNTER — Other Ambulatory Visit: Payer: Self-pay | Admitting: *Deleted

## 2024-10-09 DIAGNOSIS — E876 Hypokalemia: Secondary | ICD-10-CM

## 2024-10-09 MED ORDER — POTASSIUM CHLORIDE CRYS ER 20 MEQ PO TBCR: 40.0000 meq | EXTENDED_RELEASE_TABLET | Freq: Two times a day (BID) | ORAL | 6 refills | Status: AC

## 2024-10-13 ENCOUNTER — Ambulatory Visit

## 2024-10-19 ENCOUNTER — Encounter: Attending: Internal Medicine | Admitting: *Deleted

## 2024-10-19 VITALS — BP 106/71 | HR 71 | Temp 97.8°F | Resp 16

## 2024-10-19 DIAGNOSIS — M810 Age-related osteoporosis without current pathological fracture: Secondary | ICD-10-CM | POA: Diagnosis not present

## 2024-10-19 MED ORDER — ROMOSOZUMAB-AQQG 105 MG/1.17ML ~~LOC~~ SOSY
210.0000 mg | PREFILLED_SYRINGE | Freq: Once | SUBCUTANEOUS | Status: AC
  Administered 2024-10-19: 210 mg via SUBCUTANEOUS

## 2024-10-19 NOTE — Progress Notes (Signed)
 Diagnosis: Osteoporosis  Provider:  Shona Salvo MD  Procedure: Injection  Evenity  (Romosozumab -aqqg), Dose: 210 mg, Site: subcutaneous, Number of injections: 2  Injection Site(s): Left lower quad. abdomen and Right lower quad. abdomne  Post Care: Observation period completed  Discharge: Condition: Good, Destination: Home . AVS Provided  Performed by:  Baldwin Darice Helling, RN

## 2024-10-25 ENCOUNTER — Other Ambulatory Visit (HOSPITAL_COMMUNITY): Payer: Self-pay | Admitting: Internal Medicine

## 2024-10-25 DIAGNOSIS — Z1231 Encounter for screening mammogram for malignant neoplasm of breast: Secondary | ICD-10-CM

## 2024-11-03 ENCOUNTER — Encounter (HOSPITAL_COMMUNITY): Payer: Self-pay

## 2024-11-03 ENCOUNTER — Ambulatory Visit (HOSPITAL_COMMUNITY)
Admission: RE | Admit: 2024-11-03 | Discharge: 2024-11-03 | Disposition: A | Discharge status: Home / Self Care | Source: Ambulatory Visit | Attending: Internal Medicine | Admitting: Internal Medicine

## 2024-11-03 DIAGNOSIS — Z1231 Encounter for screening mammogram for malignant neoplasm of breast: Secondary | ICD-10-CM | POA: Diagnosis present

## 2024-11-16 ENCOUNTER — Encounter: Attending: Internal Medicine | Admitting: *Deleted

## 2024-11-16 VITALS — BP 103/63 | HR 67 | Temp 98.1°F | Resp 18

## 2024-11-16 DIAGNOSIS — M810 Age-related osteoporosis without current pathological fracture: Secondary | ICD-10-CM | POA: Diagnosis not present

## 2024-11-16 MED ORDER — ROMOSOZUMAB-AQQG 105 MG/1.17ML ~~LOC~~ SOSY
210.0000 mg | PREFILLED_SYRINGE | Freq: Once | SUBCUTANEOUS | Status: AC
  Administered 2024-11-16: 210 mg via SUBCUTANEOUS

## 2024-11-16 NOTE — Progress Notes (Signed)
 Diagnosis: Osteoporosis  Provider:  Shona Salvo MD  Procedure: Injection  Evenity  (Romosozumab -aqqg), Dose: 210 mg, Site: subcutaneous, Number of injections: 2  Injection Site(s): Left lower quad. Abdomen & right lower abdomen  Post Care: Patient declined observation  Discharge: Condition: Good, Destination: Home . AVS Provided  Performed by:  Reshad Saab Ragsdale, RN

## 2024-12-05 ENCOUNTER — Encounter: Payer: Self-pay | Admitting: Internal Medicine

## 2024-12-05 NOTE — Addendum Note (Signed)
 Addended by: DAYNE SHERRY RAMAN on: 12/05/2024 11:05 AM   Modules accepted: Orders

## 2024-12-15 ENCOUNTER — Telehealth: Payer: Self-pay

## 2024-12-15 ENCOUNTER — Encounter: Attending: Internal Medicine | Admitting: Emergency Medicine

## 2024-12-15 VITALS — BP 101/68 | HR 80 | Temp 97.9°F | Resp 16

## 2024-12-15 DIAGNOSIS — M810 Age-related osteoporosis without current pathological fracture: Secondary | ICD-10-CM | POA: Insufficient documentation

## 2024-12-15 MED ORDER — ROMOSOZUMAB-AQQG 105 MG/1.17ML ~~LOC~~ SOSY
210.0000 mg | PREFILLED_SYRINGE | Freq: Once | SUBCUTANEOUS | Status: AC
  Administered 2024-12-15: 210 mg via SUBCUTANEOUS

## 2024-12-15 NOTE — Progress Notes (Signed)
 Diagnosis: Osteoporosis  Provider:  Shona Salvo MD  Procedure: Injection  Evenity  (Romosozumab -aqqg), Dose: 210 mg, Site: subcutaneous, Number of injections: 2  Injection Site(s): Left upper quad. abdomen and Right upper quad. abdomen  Post Care: Patient declined observation  Discharge: Condition: Good, Destination: Home . AVS Provided  Performed by:  Delon ONEIDA Officer, RN

## 2024-12-15 NOTE — Telephone Encounter (Signed)
 Auth Submission: APPROVED Site of care: Site of care: [facility] Payer: aetna state Medication & CPT/J Code(s) submitted: Evenity  (Romosozumab ) [id number] Diagnosis Code:  Route of submission (phone, fax, portal): portal Phone # Fax # Auth type: Buy/Bill PB Units/visits requested: 210mg , q4weeks Reference number: [id number] Approval from: 11/23/24 to 10/23/25

## 2024-12-20 LAB — LAB REPORT - SCANNED: EGFR: 51

## 2025-01-19 ENCOUNTER — Encounter: Attending: Internal Medicine | Admitting: *Deleted

## 2025-01-19 VITALS — BP 98/57 | HR 85 | Temp 97.7°F | Resp 16

## 2025-01-19 DIAGNOSIS — M810 Age-related osteoporosis without current pathological fracture: Secondary | ICD-10-CM

## 2025-01-19 MED ORDER — ROMOSOZUMAB-AQQG 105 MG/1.17ML ~~LOC~~ SOSY
210.0000 mg | PREFILLED_SYRINGE | Freq: Once | SUBCUTANEOUS | Status: AC
  Administered 2025-01-19: 210 mg via SUBCUTANEOUS

## 2025-01-19 NOTE — Progress Notes (Cosign Needed)
 Diagnosis: Osteoporosis  Provider:  Shona Salvo MD  Procedure: Injection  Evenity  (Romosozumab -aqqg), Dose: 210 mg, Site: subcutaneous, Number of injections: 2  Injection Site(s): Left lower quad. abdomen 105 mg   Right lower quad. Abdomnen 105 mg  Post Care: Observation period completed  Discharge: Condition: Good, Destination: Home . AVS Declined  Performed by:  Baldwin Darice Helling, RN

## 2025-02-16 ENCOUNTER — Ambulatory Visit

## 2025-02-16 ENCOUNTER — Inpatient Hospital Stay

## 2025-02-23 ENCOUNTER — Inpatient Hospital Stay: Admitting: Oncology

## 2025-02-23 ENCOUNTER — Ambulatory Visit: Admitting: Oncology

## 2025-04-30 ENCOUNTER — Ambulatory Visit: Admitting: Family Medicine
